# Patient Record
Sex: Female | Born: 1975
Health system: Southern US, Community
[De-identification: ages and names within clinical notes are randomized; demographics above are authoritative.]

## PROBLEM LIST (undated history)

## (undated) DIAGNOSIS — H548 Legal blindness, as defined in USA: Secondary | ICD-10-CM

## (undated) DIAGNOSIS — C801 Malignant (primary) neoplasm, unspecified: Secondary | ICD-10-CM

## (undated) HISTORY — PX: OTHER SURGICAL HISTORY: SHX169

## (undated) HISTORY — PX: INTRAUTERINE DEVICE (IUD) INSERTION: SHX5877

## (undated) HISTORY — PX: BREAST ENHANCEMENT SURGERY: SHX7

## (undated) HISTORY — DX: Legal blindness, as defined in USA: H54.8

## (undated) HISTORY — PX: AUGMENTATION MAMMAPLASTY: SUR837

---

## 2006-05-28 ENCOUNTER — Emergency Department: Payer: Self-pay | Admitting: Unknown Physician Specialty

## 2007-03-08 ENCOUNTER — Observation Stay: Payer: Self-pay | Admitting: Obstetrics & Gynecology

## 2007-03-22 ENCOUNTER — Ambulatory Visit: Payer: Self-pay | Admitting: Obstetrics and Gynecology

## 2007-03-24 ENCOUNTER — Inpatient Hospital Stay: Payer: Self-pay

## 2007-05-01 ENCOUNTER — Ambulatory Visit: Payer: Self-pay | Admitting: Pediatrics

## 2016-09-27 DIAGNOSIS — Z23 Encounter for immunization: Secondary | ICD-10-CM | POA: Diagnosis not present

## 2016-09-27 DIAGNOSIS — Z131 Encounter for screening for diabetes mellitus: Secondary | ICD-10-CM | POA: Diagnosis not present

## 2016-09-27 DIAGNOSIS — Z0000 Encounter for general adult medical examination without abnormal findings: Secondary | ICD-10-CM | POA: Diagnosis not present

## 2016-09-27 DIAGNOSIS — K429 Umbilical hernia without obstruction or gangrene: Secondary | ICD-10-CM | POA: Diagnosis not present

## 2016-12-30 DIAGNOSIS — L308 Other specified dermatitis: Secondary | ICD-10-CM | POA: Diagnosis not present

## 2017-02-17 ENCOUNTER — Encounter: Payer: Self-pay | Admitting: Obstetrics & Gynecology

## 2017-02-17 ENCOUNTER — Ambulatory Visit (INDEPENDENT_AMBULATORY_CARE_PROVIDER_SITE_OTHER): Payer: BLUE CROSS/BLUE SHIELD | Admitting: Obstetrics & Gynecology

## 2017-02-17 VITALS — BP 90/60 | HR 64 | Ht 65.5 in | Wt 146.0 lb

## 2017-02-17 DIAGNOSIS — Z124 Encounter for screening for malignant neoplasm of cervix: Secondary | ICD-10-CM | POA: Diagnosis not present

## 2017-02-17 DIAGNOSIS — Z1231 Encounter for screening mammogram for malignant neoplasm of breast: Secondary | ICD-10-CM

## 2017-02-17 DIAGNOSIS — Z01419 Encounter for gynecological examination (general) (routine) without abnormal findings: Secondary | ICD-10-CM

## 2017-02-17 DIAGNOSIS — Z0000 Encounter for general adult medical examination without abnormal findings: Secondary | ICD-10-CM

## 2017-02-17 DIAGNOSIS — Z1239 Encounter for other screening for malignant neoplasm of breast: Secondary | ICD-10-CM

## 2017-02-17 NOTE — Progress Notes (Signed)
HPI:      [name] is a 41 y.o. G2P1011 who LMP was Patient's last menstrual period was 05/27/2006., she presents today for her annual examination. The patient has no complaints today. The patient is sexually active. Her last pap: approximate date 2015 and was normal and last mammogram: was normal. The patient does perform self breast exams.  There is no notable family history of breast or ovarian cancer in her family.  The patient has regular exercise: yes.  The patient denies current symptoms of depression.    GYN History: Contraception: IUD  PMHx: Past Medical History:  Diagnosis Date  . Legally blind    Past Surgical History:  Procedure Laterality Date  . BREAST ENHANCEMENT SURGERY    . CESAREAN SECTION    . cyro    . INTRAUTERINE DEVICE (IUD) INSERTION     Family History  Problem Relation Age of Onset  . Diabetes Maternal Grandmother   . Breast cancer Maternal Aunt   . Diabetes Maternal Aunt    Social History  Substance Use Topics  . Smoking status: Never Smoker  . Smokeless tobacco: Never Used  . Alcohol use Yes    Current Outpatient Prescriptions:  .  meloxicam (MOBIC) 7.5 MG tablet, Take 1-2 tablets daily as needed. Take with food., Disp: , Rfl:  .  tiZANidine (ZANAFLEX) 2 MG tablet, Take 1-2 tablets three times a day as needed for muscle spasm. Caution sedation., Disp: , Rfl:  Allergies: Patient has no known allergies.  Review of Systems  Constitutional: Negative for chills, fever and malaise/fatigue.  HENT: Negative for congestion, sinus pain and sore throat.   Eyes: Negative for blurred vision and pain.  Respiratory: Negative for cough and wheezing.   Cardiovascular: Negative for chest pain and leg swelling.  Gastrointestinal: Negative for abdominal pain, constipation, diarrhea, heartburn, nausea and vomiting.  Genitourinary: Negative for dysuria, frequency, hematuria and urgency.  Musculoskeletal: Negative for back pain, joint pain, myalgias  and neck pain.  Skin: Negative for itching and rash.  Neurological: Negative for dizziness, tremors and weakness.  Endo/Heme/Allergies: Does not bruise/bleed easily.  Psychiatric/Behavioral: Negative for depression. The patient is not nervous/anxious and does not have insomnia.     Objective: BP 90/60   Pulse 64   Ht 5' 5.5" (1.664 m)   Wt 146 lb (66.2 kg)   LMP 05/27/2006   BMI 23.93 kg/m   Filed Weights   02/17/17 1004  Weight: 146 lb (66.2 kg)   Body mass index is 23.93 kg/m. Physical Exam  Constitutional: She is oriented to person, place, and time. She appears well-developed and well-nourished. No distress.  Genitourinary: Rectum normal, vagina normal and uterus normal. Pelvic exam was performed with patient supine. There is no rash or lesion on the right labia. There is no rash or lesion on the left labia. Vagina exhibits no lesion. No bleeding in the vagina. Right adnexum does not display mass and does not display tenderness. Left adnexum does not display mass and does not display tenderness. Cervix does not exhibit motion tenderness, lesion, friability or polyp.   Uterus is mobile and midaxial. Uterus is not enlarged or exhibiting a mass.  Genitourinary Comments: IUD strings 1 cm seen  HENT:  Head: Normocephalic and atraumatic. Head is without laceration.  Right Ear: Hearing normal.  Left Ear: Hearing normal.  Nose: No epistaxis.  No foreign bodies.  Mouth/Throat: Uvula is midline, oropharynx is clear and moist and mucous membranes are normal.  Eyes: Pupils  are equal, round, and reactive to light.  Neck: Normal range of motion. Neck supple. No thyromegaly present.  Cardiovascular: Normal rate and regular rhythm.  Exam reveals no gallop and no friction rub.   No murmur heard. Pulmonary/Chest: Effort normal and breath sounds normal. No respiratory distress. She has no wheezes. Right breast exhibits no mass, no skin change and no tenderness. Left breast exhibits no mass, no  skin change and no tenderness.  Implants symmetrical, intact  Abdominal: Soft. Bowel sounds are normal. She exhibits no distension. There is no tenderness. There is no rebound.  Musculoskeletal: Normal range of motion.  Neurological: She is alert and oriented to person, place, and time. No cranial nerve deficit.  Skin: Skin is warm and dry.  Psychiatric: She has a normal mood and affect. Judgment normal.  Vitals reviewed.   Assessment:  ANNUAL EXAM 1. Annual physical exam   2. Screening for breast cancer   3. Screening for cervical cancer      Screening Plan:            1.  Cervical Screening-  Pap smear done today  2. Breast screening- Exam annually and mammogram>40 planned   3. Labs managed by PCP  4. Counseling for contraception: IUD; plan exchange next year  Other:  1. Annual physical exam  2. Screening for breast cancer - MM DIGITAL SCREENING BILATERAL; Future  3. Screening for cervical cancer - IGP, Aptima HPV  4. Ca and Vit D for OP prevention, exercise too    F/U  Return in about 1 year (around 02/17/2018) for Annual and IUD exchange.  Barnett Applebaum, MD, [facility], [facility] 02/17/2017  10:45 AM

## 2017-02-17 NOTE — Patient Instructions (Signed)
PAP every three years Mammogram every year    Call [phone] to schedule at [facility] yearly (with PCP)   

## 2017-02-21 LAB — IGP, APTIMA HPV
HPV Aptima: NEGATIVE
PAP Smear Comment: 0

## 2017-04-06 ENCOUNTER — Ambulatory Visit
Admission: RE | Admit: 2017-04-06 | Discharge: 2017-04-06 | Disposition: A | Discharge status: Home / Self Care | Payer: BLUE CROSS/BLUE SHIELD | Source: Ambulatory Visit | Attending: Obstetrics & Gynecology | Admitting: Obstetrics & Gynecology

## 2017-04-06 ENCOUNTER — Other Ambulatory Visit: Payer: Self-pay | Admitting: Obstetrics & Gynecology

## 2017-04-06 DIAGNOSIS — Z1239 Encounter for other screening for malignant neoplasm of breast: Secondary | ICD-10-CM

## 2017-04-06 DIAGNOSIS — Z1231 Encounter for screening mammogram for malignant neoplasm of breast: Secondary | ICD-10-CM | POA: Diagnosis not present

## 2017-04-06 HISTORY — DX: Malignant (primary) neoplasm, unspecified: C80.1

## 2017-04-13 ENCOUNTER — Inpatient Hospital Stay
Admission: RE | Admit: 2017-04-13 | Discharge: 2017-04-13 | Disposition: A | Discharge status: Home / Self Care | Payer: Self-pay | Source: Ambulatory Visit | Attending: *Deleted | Admitting: *Deleted

## 2017-04-13 ENCOUNTER — Other Ambulatory Visit: Payer: Self-pay | Admitting: *Deleted

## 2017-04-13 DIAGNOSIS — Z9289 Personal history of other medical treatment: Secondary | ICD-10-CM

## 2017-04-14 ENCOUNTER — Encounter: Payer: Self-pay | Admitting: Obstetrics & Gynecology

## 2017-05-03 DIAGNOSIS — R1013 Epigastric pain: Secondary | ICD-10-CM | POA: Diagnosis not present

## 2017-05-03 DIAGNOSIS — R110 Nausea: Secondary | ICD-10-CM | POA: Diagnosis not present

## 2017-05-18 DIAGNOSIS — D729 Disorder of white blood cells, unspecified: Secondary | ICD-10-CM | POA: Diagnosis not present

## 2017-12-29 DIAGNOSIS — D2262 Melanocytic nevi of left upper limb, including shoulder: Secondary | ICD-10-CM | POA: Diagnosis not present

## 2017-12-29 DIAGNOSIS — D2261 Melanocytic nevi of right upper limb, including shoulder: Secondary | ICD-10-CM | POA: Diagnosis not present

## 2017-12-29 DIAGNOSIS — D2272 Melanocytic nevi of left lower limb, including hip: Secondary | ICD-10-CM | POA: Diagnosis not present

## 2017-12-29 DIAGNOSIS — D225 Melanocytic nevi of trunk: Secondary | ICD-10-CM | POA: Diagnosis not present

## 2018-02-24 ENCOUNTER — Encounter: Payer: Self-pay | Admitting: Obstetrics & Gynecology

## 2018-02-24 ENCOUNTER — Ambulatory Visit (INDEPENDENT_AMBULATORY_CARE_PROVIDER_SITE_OTHER): Payer: BLUE CROSS/BLUE SHIELD | Admitting: Obstetrics & Gynecology

## 2018-02-24 VITALS — BP 102/60 | Ht 65.5 in | Wt 149.0 lb

## 2018-02-24 DIAGNOSIS — Z30433 Encounter for removal and reinsertion of intrauterine contraceptive device: Secondary | ICD-10-CM | POA: Diagnosis not present

## 2018-02-24 DIAGNOSIS — Z0000 Encounter for general adult medical examination without abnormal findings: Secondary | ICD-10-CM

## 2018-02-24 DIAGNOSIS — Z01419 Encounter for gynecological examination (general) (routine) without abnormal findings: Secondary | ICD-10-CM | POA: Diagnosis not present

## 2018-02-24 DIAGNOSIS — Z1239 Encounter for other screening for malignant neoplasm of breast: Secondary | ICD-10-CM

## 2018-02-24 NOTE — Progress Notes (Signed)
HPI:      Cynthia Osborn is a 42 y.o. G2P1011 who LMP was No LMP recorded. (Menstrual status: IUD)., she presents today for her annual examination. The patient has no complaints today. The patient is sexually active. Her last pap: approximate date 2018 and was normal and last mammogram: approximate date 2018 and was normal. The patient does perform self breast exams.  There is no notable family history of breast or ovarian cancer in her family.  The patient has regular exercise: yes.  The patient denies current symptoms of depression.    GYN History: Contraception: IUD  PMHx: Past Medical History:  Diagnosis Date  . Cancer ([place])    skin  . Legally blind    Past Surgical History:  Procedure Laterality Date  . AUGMENTATION MAMMAPLASTY Bilateral    2000  . BREAST ENHANCEMENT SURGERY    . CESAREAN SECTION    . cyro    . INTRAUTERINE DEVICE (IUD) INSERTION     Family History  Problem Relation Age of Onset  . Diabetes Maternal Grandmother   . Breast cancer Maternal Aunt   . Diabetes Maternal Aunt    Social History   Tobacco Use  . Smoking status: Never Smoker  . Smokeless tobacco: Never Used  Substance Use Topics  . Alcohol use: Yes  . Drug use: No    Current Outpatient Medications:  .  meloxicam (MOBIC) 7.5 MG tablet, Take 1-2 tablets daily as needed. Take with food., Disp: , Rfl:  .  tiZANidine (ZANAFLEX) 2 MG tablet, Take 1-2 tablets three times a day as needed for muscle spasm. Caution sedation., Disp: , Rfl:  Allergies: Patient has no known allergies.  Review of Systems  Constitutional: Negative for chills, fever and malaise/fatigue.  HENT: Negative for congestion, sinus pain and sore throat.   Eyes: Negative for blurred vision and pain.  Respiratory: Negative for cough and wheezing.   Cardiovascular: Negative for chest pain and leg swelling.  Gastrointestinal: Negative for abdominal pain, constipation, diarrhea, heartburn, nausea and vomiting.    Genitourinary: Negative for dysuria, frequency, hematuria and urgency.  Musculoskeletal: Negative for back pain, joint pain, myalgias and neck pain.  Skin: Negative for itching and rash.  Neurological: Negative for dizziness, tremors and weakness.  Endo/Heme/Allergies: Does not bruise/bleed easily.  Psychiatric/Behavioral: Negative for depression. The patient is not nervous/anxious and does not have insomnia.     Objective: BP 102/60   Ht 5' 5.5" (1.664 m)   Wt 149 lb (67.6 kg)   BMI 24.42 kg/m   Filed Weights   02/24/18 0900  Weight: 149 lb (67.6 kg)   Body mass index is 24.42 kg/m. Physical Exam  Constitutional: She is oriented to person, place, and time. She appears well-developed and well-nourished. No distress.  Genitourinary: Rectum normal, vagina normal and uterus normal. Pelvic exam was performed with patient supine. There is no rash or lesion on the right labia. There is no rash or lesion on the left labia. Vagina exhibits no lesion. No bleeding in the vagina. Right adnexum does not display mass and does not display tenderness. Left adnexum does not display mass and does not display tenderness. Cervix does not exhibit motion tenderness, lesion, friability or polyp.   Uterus is mobile and midaxial. Uterus is not enlarged or exhibiting a mass.  Genitourinary Comments: IUD string 2 cm  HENT:  Head: Normocephalic and atraumatic. Head is without laceration.  Right Ear: Hearing normal.  Left Ear: Hearing normal.  Nose: No epistaxis.  No foreign bodies.  Mouth/Throat: Uvula is midline, oropharynx is clear and moist and mucous membranes are normal.  Eyes: Pupils are equal, round, and reactive to light.  Neck: Normal range of motion. Neck supple. No thyromegaly present.  Cardiovascular: Normal rate and regular rhythm. Exam reveals no gallop and no friction rub.  No murmur heard. Pulmonary/Chest: Effort normal and breath sounds normal. No respiratory distress. She has no wheezes.  Right breast exhibits no mass, no skin change and no tenderness. Left breast exhibits no mass, no skin change and no tenderness.  Abdominal: Soft. Bowel sounds are normal. She exhibits no distension. There is no tenderness. There is no rebound.  Musculoskeletal: Normal range of motion.  Neurological: She is alert and oriented to person, place, and time. No cranial nerve deficit.  Skin: Skin is warm and dry.  Psychiatric: She has a normal mood and affect. Judgment normal.  Vitals reviewed.   Assessment:  ANNUAL EXAM 1. Annual physical exam   2. Encounter for removal and reinsertion of intrauterine contraceptive device   3. Screening for breast cancer      Screening Plan:            1.  Cervical Screening-  Pap smear schedule reviewed with patient  2. Breast screening- Exam annually and mammogram>40 planned   3. Colonoscopy every 10 years, Hemoccult testing - after age 25  4. Labs managed by PCP  5. Counseling for contraception: IUD   6.  Encounter for removal and reinsertion of intrauterine contraceptive device  IUD Removal Strings of IUD identified and grasped.  IUD removed without problem.  Pt tolerated this well.  IUD noted to be intact.  IUD Insertion Procedure Note Patient identified, informed consent performed, consent signed.   Discussed risks of irregular bleeding, cramping, infection, malpositioning or misplacement of the IUD outside the uterus which may require further procedure such as laparoscopy, risk of failure <1%. Time out was performed.  Urine pregnancy test negative.  A bimanual exam showed the uterus to be midposition.  Speculum placed in the vagina.  Cervix visualized.  Cleaned with Betadine x 2.  Grasped anteriorly with a single tooth tenaculum.  Uterus sounded to 8 cm.   IUD placed per manufacturer's recommendations.  Strings trimmed to 3 cm. Tenaculum was removed, good hemostasis noted.  Patient tolerated procedure well.   Patient was given post-procedure  instructions.  She was advised to have backup contraception for one week.  Patient was also asked to check IUD strings periodically and follow up in 4 weeks for IUD check.    F/U  Return in about 4 weeks (around 03/24/2018) for Follow up.  Barnett Applebaum, MD, [facility], [facility] 02/24/2018  9:31 AM

## 2018-02-24 NOTE — Patient Instructions (Addendum)
PAP every three years Mammogram every year    Call [phone] to schedule at [facility] (after 04/06/18) Labs yearly (with PCP)  Mirena Intrauterine Device Insertion, Care After This sheet gives you information about how to care for yourself after your procedure. Your health care provider may also give you more specific instructions. If you have problems or questions, contact your health care provider. What can I expect after the procedure? After the procedure, it is common to have:  Cramps and pain in the abdomen.  Light bleeding (spotting) or heavier bleeding that is like your menstrual period. This may last for up to a few days.  Lower back pain.  Dizziness.  Headaches.  Nausea.  Follow these instructions at home:  Before resuming sexual activity, check to make sure that you can feel the IUD string(s). You should be able to feel the end of the string(s) below the opening of your cervix. If your IUD string is in place, you may resume sexual activity. ? If you had a hormonal IUD inserted more than 7 days after your most recent period started, you will need to use a backup method of birth control for 7 days after IUD insertion. Ask your health care provider whether this applies to you.  Continue to check that the IUD is still in place by feeling for the string(s) after every menstrual period, or once a month.  Take over-the-counter and prescription medicines only as told by your health care provider.  Do not drive or use heavy machinery while taking prescription pain medicine.  Keep all follow-up visits as told by your health care provider. This is important. Contact a health care provider if:  You have bleeding that is heavier or lasts longer than a normal menstrual cycle.  You have a fever.  You have cramps or abdominal pain that get worse or do not get better with medicine.  You develop abdominal pain that is new or is not in the same area of earlier cramping and  pain.  You feel lightheaded or weak.  You have abnormal or bad-smelling discharge from your vagina.  You have pain during sexual activity.  You have any of the following problems with your IUD string(s): ? The string bothers or hurts you or your sexual partner. ? You cannot feel the string. ? The string has gotten longer.  You can feel the IUD in your vagina.  You think you may be pregnant, or you miss your menstrual period.  You think you may have an STI (sexually transmitted infection). Get help right away if:  You have flu-like symptoms.  You have a fever and chills.  You can feel that your IUD has slipped out of place. Summary  After the procedure, it is common to have cramps and pain in the abdomen. It is also common to have light bleeding (spotting) or heavier bleeding that is like your menstrual period.  Continue to check that the IUD is still in place by feeling for the string(s) after every menstrual period, or once a month.  Keep all follow-up visits as told by your health care provider. This is important.  Contact your health care provider if you have problems with your IUD string(s), such as the string getting longer or bothering you or your sexual partner. This information is not intended to replace advice given to you by your health care provider. Make sure you discuss any questions you have with your health care provider. Document Released: 12/09/2010 Document Revised: 03/03/2016  Document Reviewed: 03/03/2016 Elsevier Interactive Patient Education  2017 [employer].

## 2018-03-27 ENCOUNTER — Ambulatory Visit (INDEPENDENT_AMBULATORY_CARE_PROVIDER_SITE_OTHER): Payer: BLUE CROSS/BLUE SHIELD | Admitting: Obstetrics & Gynecology

## 2018-03-27 ENCOUNTER — Encounter: Payer: Self-pay | Admitting: Obstetrics & Gynecology

## 2018-03-27 VITALS — BP 100/60 | Ht 65.5 in | Wt 150.0 lb

## 2018-03-27 DIAGNOSIS — Z30431 Encounter for routine checking of intrauterine contraceptive device: Secondary | ICD-10-CM

## 2018-03-27 NOTE — Progress Notes (Signed)
  History of Present Illness:  Cynthia Osborn is a 42 y.o. that had a Mirena IUD placed approximately 4 weeks ago. Since that time, she states that she has had no bleeding and pain  PMHx: She  has a past medical history of Cancer ([place]) and Legally blind. Also,  has a past surgical history that includes Breast enhancement surgery; Cesarean section; cyro; Intrauterine device (iud) insertion; and Augmentation mammaplasty (Bilateral)., family history includes Breast cancer in her maternal aunt; Diabetes in her maternal aunt and maternal grandmother.,  reports that she has never smoked. She has never used smokeless tobacco. She reports that she drinks alcohol. She reports that she does not use drugs. Current Meds  Medication Sig  . levonorgestrel (MIRENA) 20 MCG/24HR IUD 1 each by Intrauterine route once.  .  Also, has No Known Allergies..  ROS  Physical Exam:  BP 100/60   Ht 5' 5.5" (1.664 m)   Wt 150 lb (68 kg)   BMI 24.58 kg/m  Body mass index is 24.58 kg/m. Constitutional: Well nourished, well developed female in no acute distress.  Abdomen: diffusely non tender to palpation, non distended, and no masses, hernias Neuro: Grossly intact Psych:  Normal mood and affect.    Pelvic exam:  Two IUD strings present seen coming from the cervical os. EGBUS, vaginal vault and cervix: within normal limits  Assessment: IUD strings present in proper location; pt doing well  Plan: She was told to continue to use barrier contraception, in order to prevent any STIs, and to take a home pregnancy test or call us if she ever thinks she may be pregnant, and that her IUD expires in 5 (-7) years.  She was amenable to this plan and we will see her back in 1 year/PRN.  A total of 15 minutes were spent face-to-face with the patient during this encounter and over half of that time dealt with counseling and coordination of care.  Barnett Applebaum, MD, [facility], [facility] 03/27/2018   10:10 AM

## 2018-04-10 ENCOUNTER — Ambulatory Visit
Admission: RE | Admit: 2018-04-10 | Discharge: 2018-04-10 | Disposition: A | Discharge status: Home / Self Care | Payer: BLUE CROSS/BLUE SHIELD | Source: Ambulatory Visit | Attending: Obstetrics & Gynecology | Admitting: Obstetrics & Gynecology

## 2018-04-10 DIAGNOSIS — Z1239 Encounter for other screening for malignant neoplasm of breast: Secondary | ICD-10-CM

## 2018-04-10 DIAGNOSIS — Z1231 Encounter for screening mammogram for malignant neoplasm of breast: Secondary | ICD-10-CM | POA: Diagnosis not present

## 2019-01-04 DIAGNOSIS — L718 Other rosacea: Secondary | ICD-10-CM | POA: Diagnosis not present

## 2019-01-04 DIAGNOSIS — D2261 Melanocytic nevi of right upper limb, including shoulder: Secondary | ICD-10-CM | POA: Diagnosis not present

## 2019-01-04 DIAGNOSIS — D2262 Melanocytic nevi of left upper limb, including shoulder: Secondary | ICD-10-CM | POA: Diagnosis not present

## 2019-01-04 DIAGNOSIS — D225 Melanocytic nevi of trunk: Secondary | ICD-10-CM | POA: Diagnosis not present

## 2019-01-30 IMAGING — MG DIGITAL SCREENING BILATERAL MAMMOGRAM WITH IMPLANTS, CAD AND TOM
9 of 16 series · 9 of 32 positions shown · non-contrast
Comparison: Previous exam(s).

CLINICAL DATA: Screening.

EXAM:
DIGITAL SCREENING BILATERAL MAMMOGRAM WITH IMPLANTS, CAD AND TOMO
The patient has retroglandular implants. Standard and implant
displaced views were performed.

[R MLO (1 of 2)]
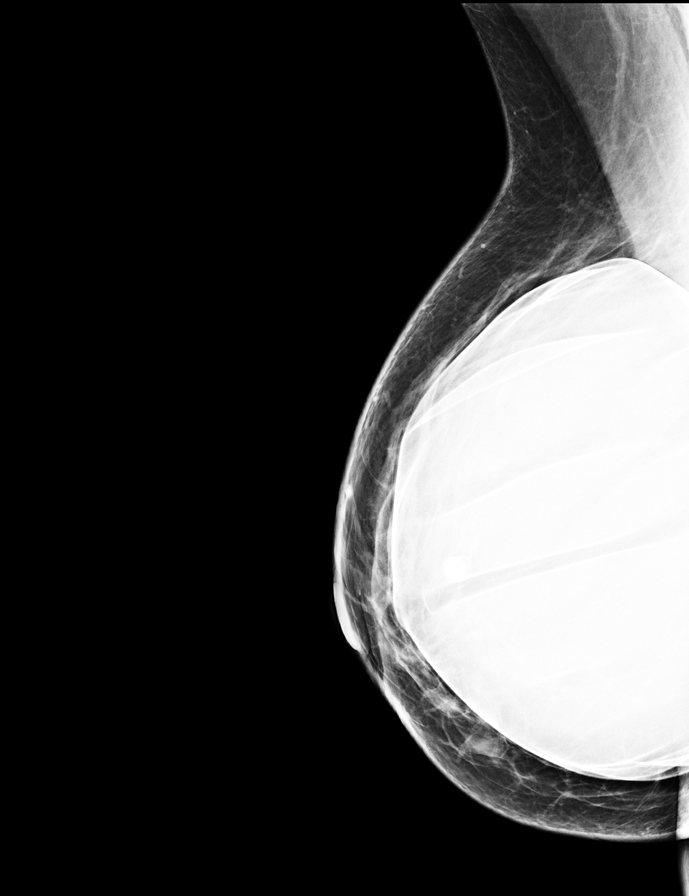

[L CC (1 of 2)]
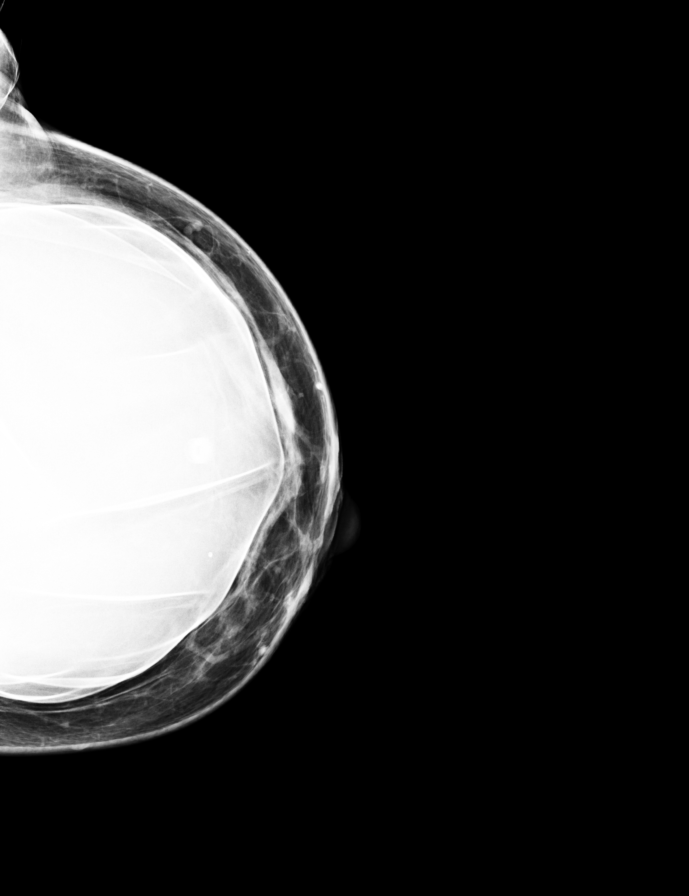

[R CC]
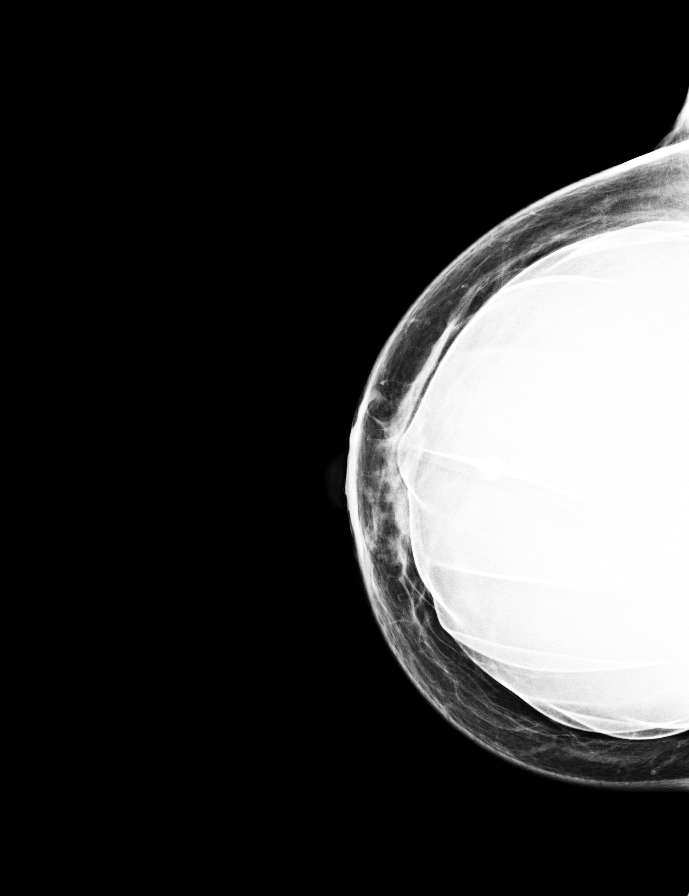

[L MLO]
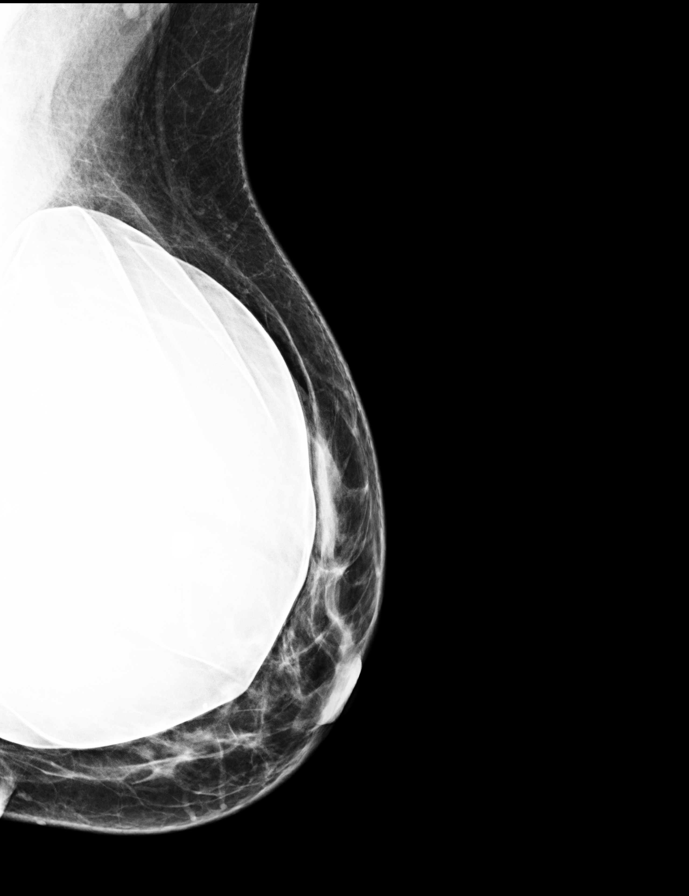

[L MLO synth-2D]
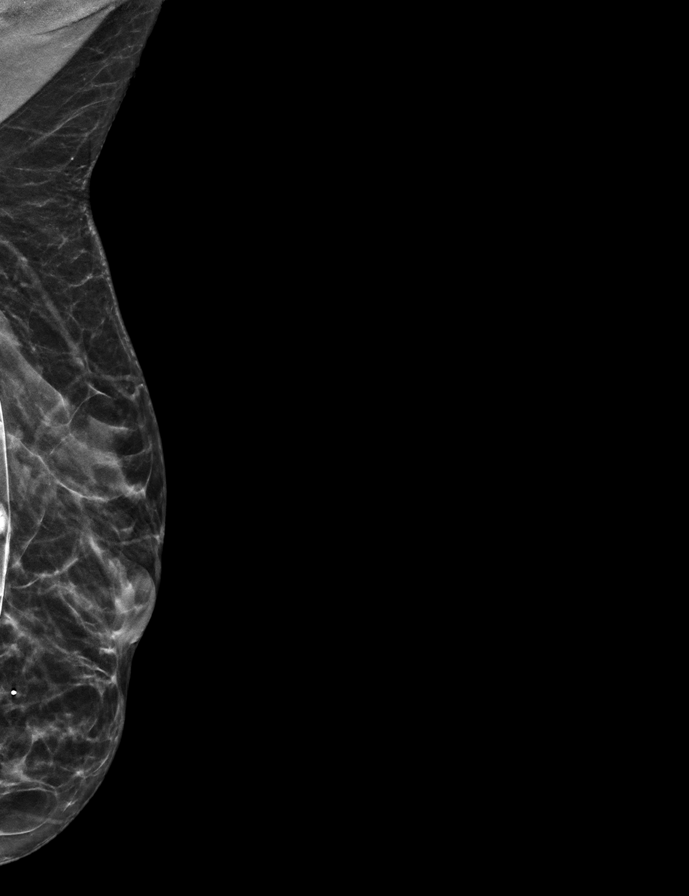

[R CC synth-2D]
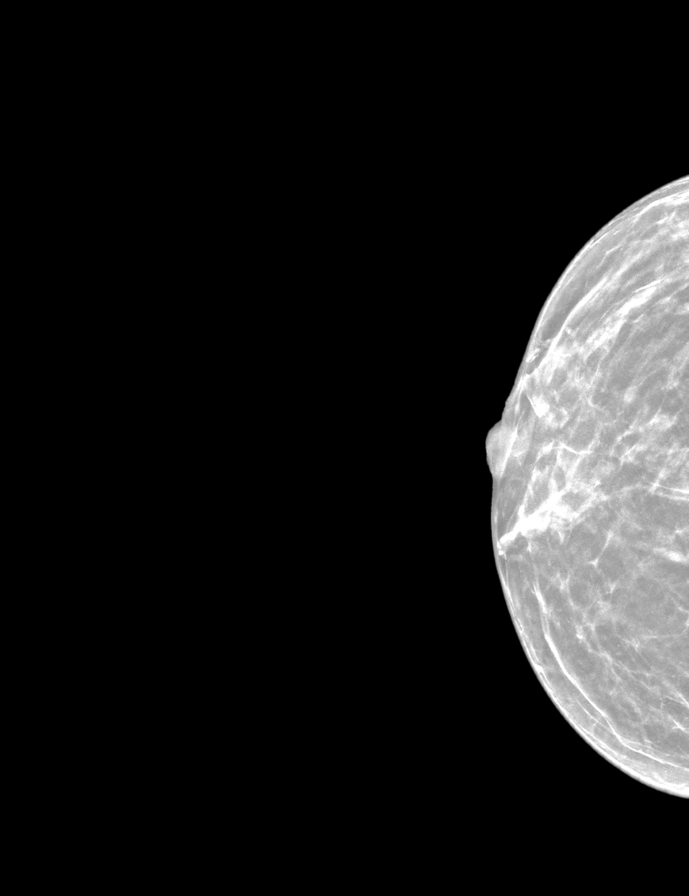

[R MLO (2 of 2)]
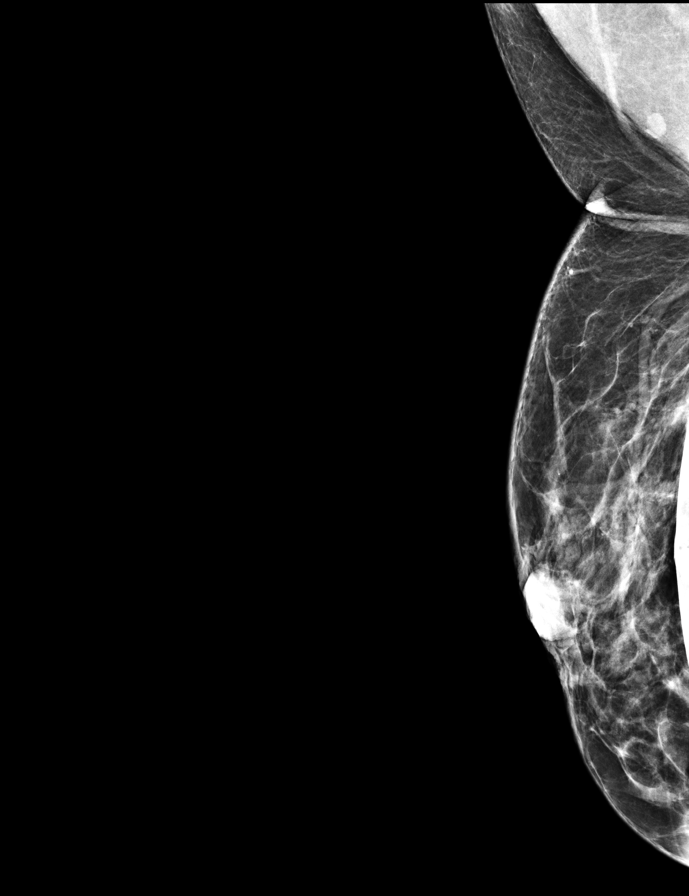

[L CC (2 of 2)]
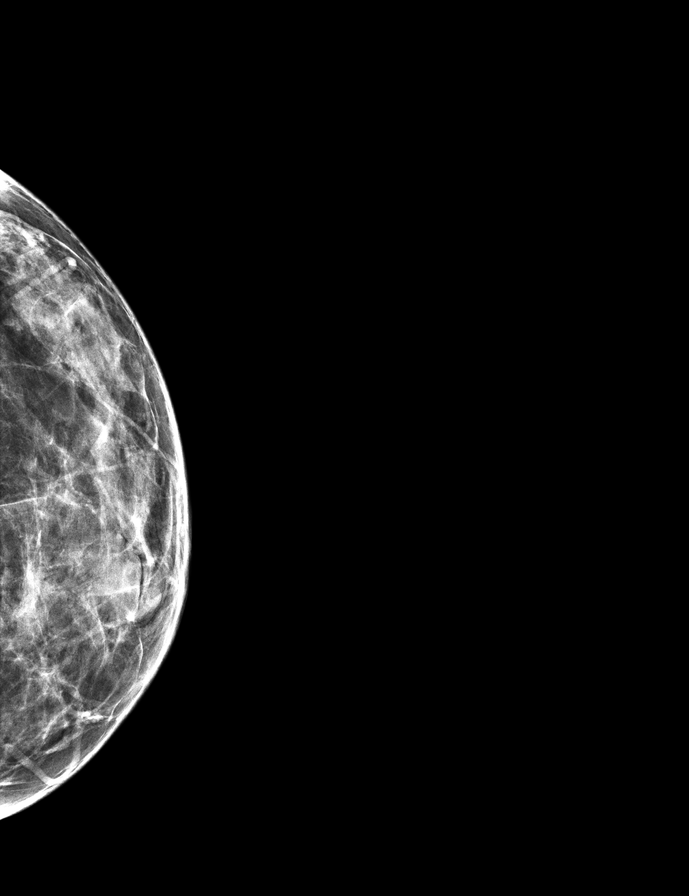

[R MLO synth-2D]
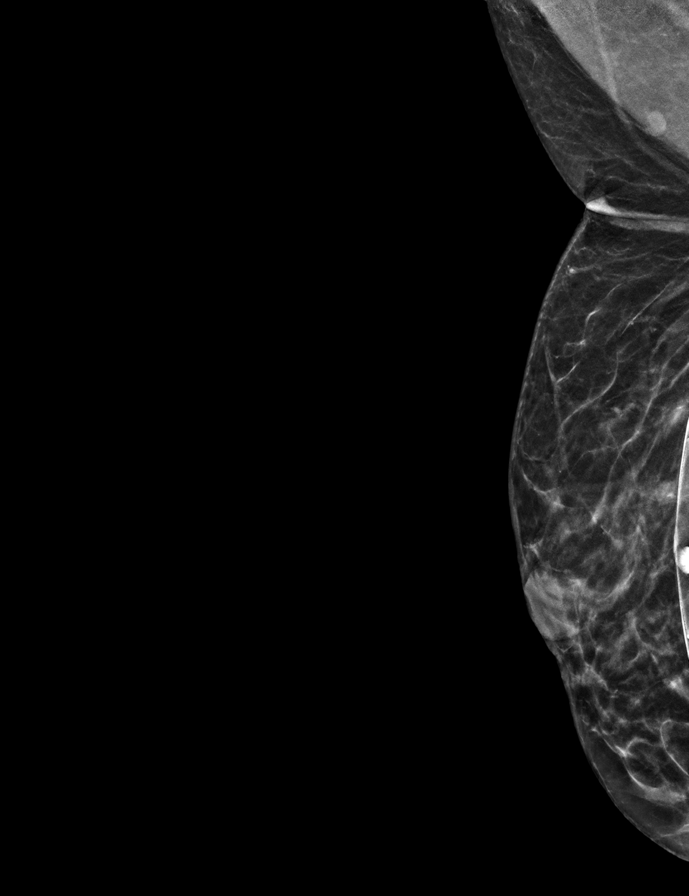

[9 of 32 positions shown; findings below may reference images not displayed]

ACR Breast Density Category b: There are scattered areas of
fibroglandular density.
FINDINGS: Bilateral retroglandular saline implants are intact and unchanged.

There are no findings suspicious for malignancy. Images were
processed with CAD.
IMPRESSION: No mammographic evidence of malignancy. A result letter of this
screening mammogram will be mailed directly to the patient.

RECOMMENDATION:
Screening mammogram in one year. (Code:9Y-R-ETC)

BI-RADS CATEGORY  1:  Negative.

## 2019-03-16 ENCOUNTER — Ambulatory Visit: Payer: BLUE CROSS/BLUE SHIELD | Admitting: Obstetrics & Gynecology

## 2019-03-27 ENCOUNTER — Ambulatory Visit (INDEPENDENT_AMBULATORY_CARE_PROVIDER_SITE_OTHER): Payer: BC Managed Care – PPO | Admitting: Obstetrics & Gynecology

## 2019-03-27 ENCOUNTER — Other Ambulatory Visit: Payer: Self-pay

## 2019-03-27 ENCOUNTER — Encounter: Payer: Self-pay | Admitting: Obstetrics & Gynecology

## 2019-03-27 VITALS — BP 120/80 | Ht 65.0 in | Wt 153.0 lb

## 2019-03-27 DIAGNOSIS — L718 Other rosacea: Secondary | ICD-10-CM | POA: Diagnosis not present

## 2019-03-27 DIAGNOSIS — Z01419 Encounter for gynecological examination (general) (routine) without abnormal findings: Secondary | ICD-10-CM

## 2019-03-27 DIAGNOSIS — Z1231 Encounter for screening mammogram for malignant neoplasm of breast: Secondary | ICD-10-CM

## 2019-03-27 DIAGNOSIS — Z79899 Other long term (current) drug therapy: Secondary | ICD-10-CM | POA: Diagnosis not present

## 2019-03-27 NOTE — Progress Notes (Signed)
HPI:      [name] is a 43 y.o. G2P1011 who LMP was No LMP recorded. (Menstrual status: IUD)., she presents today for her annual examination. The patient has no complaints today. The patient is sexually active. Her last pap: approximate date 2018 and was normal and last mammogram: approximate date 2019 and was normal. The patient does perform self breast exams.  There is no notable family history of breast or ovarian cancer in her family.  The patient has regular exercise: yes.  The patient denies current symptoms of depression.    GYN History: Contraception: IUD  PMHx: Past Medical History:  Diagnosis Date  . Cancer ([place])    skin  . Legally blind    Past Surgical History:  Procedure Laterality Date  . AUGMENTATION MAMMAPLASTY Bilateral    2000  . BREAST ENHANCEMENT SURGERY    . CESAREAN SECTION    . cyro    . INTRAUTERINE DEVICE (IUD) INSERTION     Family History  Problem Relation Age of Onset  . Diabetes Maternal Grandmother   . Breast cancer Maternal Aunt   . Diabetes Maternal Aunt    Social History   Tobacco Use  . Smoking status: Never Smoker  . Smokeless tobacco: Never Used  Substance Use Topics  . Alcohol use: Yes  . Drug use: No    Current Outpatient Medications:  .  doxycycline (VIBRAMYCIN) 50 MG capsule, Take 50 mg by mouth 2 (two) times daily., Disp: , Rfl:  .  levonorgestrel (MIRENA) 20 MCG/24HR IUD, 1 each by Intrauterine route once., Disp: , Rfl:  .  meloxicam (MOBIC) 7.5 MG tablet, Take 1-2 tablets daily as needed. Take with food., Disp: , Rfl:  .  tiZANidine (ZANAFLEX) 2 MG tablet, Take 1-2 tablets three times a day as needed for muscle spasm. Caution sedation., Disp: , Rfl:  Allergies: Patient has no known allergies.  Review of Systems  Constitutional: Negative for chills, fever and malaise/fatigue.  HENT: Negative for congestion, sinus pain and sore throat.   Eyes: Negative for blurred vision and pain.  Respiratory: Negative for cough  and wheezing.   Cardiovascular: Negative for chest pain and leg swelling.  Gastrointestinal: Negative for abdominal pain, constipation, diarrhea, heartburn, nausea and vomiting.  Genitourinary: Negative for dysuria, frequency, hematuria and urgency.  Musculoskeletal: Negative for back pain, joint pain, myalgias and neck pain.  Skin: Negative for itching and rash.  Neurological: Negative for dizziness, tremors and weakness.  Endo/Heme/Allergies: Does not bruise/bleed easily.  Psychiatric/Behavioral: Negative for depression. The patient is not nervous/anxious and does not have insomnia.     Objective: BP 120/80   Ht 5\' 5"  (1.651 m)   Wt 153 lb (69.4 kg)   BMI 25.46 kg/m   Filed Weights   03/27/19 1348  Weight: 153 lb (69.4 kg)   Body mass index is 25.46 kg/m. Physical Exam Constitutional:      General: She is not in acute distress.    Appearance: She is well-developed.  Genitourinary:     Pelvic exam was performed with patient supine.     Urethra, bladder, vagina, uterus and rectum normal.     No lesions in the vagina.     No vaginal bleeding.     No cervical motion tenderness, friability, lesion or polyp.     IUD strings visualized.     Uterus is mobile.     Uterus is not enlarged.     No uterine mass detected.  Uterus is midaxial.     No right or left adnexal mass present.     Right adnexa not tender.     Left adnexa not tender.  HENT:     Head: Normocephalic and atraumatic. No laceration.     Right Ear: Hearing normal.     Left Ear: Hearing normal.     Mouth/Throat:     Pharynx: Uvula midline.  Eyes:     Pupils: Pupils are equal, round, and reactive to light.  Neck:     Musculoskeletal: Normal range of motion and neck supple.     Thyroid: No thyromegaly.  Cardiovascular:     Rate and Rhythm: Normal rate and regular rhythm.     Heart sounds: No murmur. No friction rub. No gallop.   Pulmonary:     Effort: Pulmonary effort is normal. No respiratory distress.      Breath sounds: Normal breath sounds. No wheezing.  Chest:     Breasts:        Right: No mass, skin change or tenderness.        Left: No mass, skin change or tenderness.  Abdominal:     General: Bowel sounds are normal. There is no distension.     Palpations: Abdomen is soft.     Tenderness: There is no abdominal tenderness. There is no rebound.  Musculoskeletal: Normal range of motion.  Neurological:     Mental Status: She is alert and oriented to person, place, and time.     Cranial Nerves: No cranial nerve deficit.  Skin:    General: Skin is warm and dry.  Psychiatric:        Judgment: Judgment normal.  Vitals signs reviewed.     Assessment:  ANNUAL EXAM 1. Women's annual routine gynecological examination   2. Encounter for screening mammogram for malignant neoplasm of breast      Screening Plan:            1.  Cervical Screening-  Pap smear schedule reviewed with patient  2. Breast screening- Exam annually and mammogram>40 planned   3. Colonoscopy every 10 years, Hemoccult testing - after age 5  4. Labs managed by PCP  5. Counseling for contraception: IUD year 1 of this Mirena    F/U  Return in about 1 year (around 03/26/2020) for Annual.  Barnett Applebaum, MD, [facility], [facility] 03/27/2019  2:38 PM

## 2019-03-27 NOTE — Patient Instructions (Signed)
PAP every three years   Due 2021 Mammogram every year    Call [phone] to schedule at [facility] yearly (with PCP)

## 2019-03-29 DIAGNOSIS — Z3202 Encounter for pregnancy test, result negative: Secondary | ICD-10-CM | POA: Diagnosis not present

## 2019-03-29 DIAGNOSIS — Z79899 Other long term (current) drug therapy: Secondary | ICD-10-CM | POA: Diagnosis not present

## 2019-04-13 ENCOUNTER — Ambulatory Visit
Admission: RE | Admit: 2019-04-13 | Discharge: 2019-04-13 | Disposition: A | Discharge status: Home / Self Care | Payer: BC Managed Care – PPO | Source: Ambulatory Visit | Attending: Obstetrics & Gynecology | Admitting: Obstetrics & Gynecology

## 2019-04-13 DIAGNOSIS — Z1231 Encounter for screening mammogram for malignant neoplasm of breast: Secondary | ICD-10-CM | POA: Diagnosis not present

## 2019-04-18 DIAGNOSIS — L700 Acne vulgaris: Secondary | ICD-10-CM | POA: Diagnosis not present

## 2019-05-22 ENCOUNTER — Other Ambulatory Visit: Payer: Self-pay | Admitting: Gastroenterology

## 2019-05-22 DIAGNOSIS — R748 Abnormal levels of other serum enzymes: Secondary | ICD-10-CM

## 2019-05-22 DIAGNOSIS — R7989 Other specified abnormal findings of blood chemistry: Secondary | ICD-10-CM | POA: Diagnosis not present

## 2019-05-29 ENCOUNTER — Ambulatory Visit: Payer: BC Managed Care – PPO

## 2019-06-05 ENCOUNTER — Ambulatory Visit: Payer: BLUE CROSS/BLUE SHIELD

## 2020-01-11 DIAGNOSIS — Z20822 Contact with and (suspected) exposure to covid-19: Secondary | ICD-10-CM | POA: Diagnosis not present

## 2020-01-11 DIAGNOSIS — J0100 Acute maxillary sinusitis, unspecified: Secondary | ICD-10-CM | POA: Diagnosis not present

## 2020-02-15 DIAGNOSIS — M955 Acquired deformity of pelvis: Secondary | ICD-10-CM | POA: Diagnosis not present

## 2020-02-15 DIAGNOSIS — M9905 Segmental and somatic dysfunction of pelvic region: Secondary | ICD-10-CM | POA: Diagnosis not present

## 2020-02-15 DIAGNOSIS — M9903 Segmental and somatic dysfunction of lumbar region: Secondary | ICD-10-CM | POA: Diagnosis not present

## 2020-02-15 DIAGNOSIS — M5416 Radiculopathy, lumbar region: Secondary | ICD-10-CM | POA: Diagnosis not present

## 2020-04-28 ENCOUNTER — Ambulatory Visit (INDEPENDENT_AMBULATORY_CARE_PROVIDER_SITE_OTHER): Payer: BC Managed Care – PPO | Admitting: Obstetrics & Gynecology

## 2020-04-28 ENCOUNTER — Encounter: Payer: Self-pay | Admitting: Obstetrics & Gynecology

## 2020-04-28 ENCOUNTER — Other Ambulatory Visit: Payer: Self-pay

## 2020-04-28 ENCOUNTER — Other Ambulatory Visit (HOSPITAL_COMMUNITY)
Admission: RE | Admit: 2020-04-28 | Discharge: 2020-04-28 | Disposition: A | Discharge status: Home / Self Care | Payer: BC Managed Care – PPO | Source: Ambulatory Visit | Attending: Obstetrics & Gynecology | Admitting: Obstetrics & Gynecology

## 2020-04-28 VITALS — BP 110/60 | Ht 65.0 in | Wt 155.0 lb

## 2020-04-28 DIAGNOSIS — Z124 Encounter for screening for malignant neoplasm of cervix: Secondary | ICD-10-CM | POA: Insufficient documentation

## 2020-04-28 DIAGNOSIS — Z01419 Encounter for gynecological examination (general) (routine) without abnormal findings: Secondary | ICD-10-CM | POA: Diagnosis not present

## 2020-04-28 DIAGNOSIS — Z1231 Encounter for screening mammogram for malignant neoplasm of breast: Secondary | ICD-10-CM

## 2020-04-28 NOTE — Progress Notes (Signed)
HPI:      [name] is a 45 y.o. G2P1011 who LMP was No LMP recorded. (Menstrual status: IUD)., she presents today for her annual examination. The patient has no complaints today. The patient is sexually active. Her last pap: approximate date 2019 and was normal and last mammogram: approximate date 2020 and was normal. The patient does perform self breast exams.  There is no notable family history of breast or ovarian cancer in her family.  The patient has regular exercise: yes.  The patient denies current symptoms of depression.    GYN History: Contraception: IUD, Mirena, year 2  PMHx: Past Medical History:  Diagnosis Date  . Cancer (HCC)    skin  . Legally blind    Past Surgical History:  Procedure Laterality Date  . AUGMENTATION MAMMAPLASTY Bilateral    2000  . BREAST ENHANCEMENT SURGERY    . CESAREAN SECTION    . cyro    . INTRAUTERINE DEVICE (IUD) INSERTION     Family History  Problem Relation Age of Onset  . Diabetes Maternal Grandmother   . Breast cancer Maternal Aunt        60s  . Diabetes Maternal Aunt    Social History   Tobacco Use  . Smoking status: Never Smoker  . Smokeless tobacco: Never Used  Vaping Use  . Vaping Use: Never used  Substance Use Topics  . Alcohol use: Yes  . Drug use: No    Current Outpatient Medications:  .  levonorgestrel (MIRENA) 20 MCG/24HR IUD, 1 each by Intrauterine route once., Disp: , Rfl:  .  Ivermectin 1 % CREA, Apply topically. (Patient not taking: Reported on 04/28/2020), Disp: , Rfl:  Allergies: Patient has no known allergies.  Review of Systems  Constitutional: Negative for chills, fever and malaise/fatigue.  HENT: Negative for congestion, sinus pain and sore throat.   Eyes: Negative for blurred vision and pain.  Respiratory: Negative for cough and wheezing.   Cardiovascular: Negative for chest pain and leg swelling.  Gastrointestinal: Negative for abdominal pain, constipation, diarrhea, heartburn, nausea and  vomiting.  Genitourinary: Negative for dysuria, frequency, hematuria and urgency.  Musculoskeletal: Negative for back pain, joint pain, myalgias and neck pain.  Skin: Negative for itching and rash.  Neurological: Negative for dizziness, tremors and weakness.  Endo/Heme/Allergies: Does not bruise/bleed easily.  Psychiatric/Behavioral: Negative for depression. The patient is not nervous/anxious and does not have insomnia.     Objective: BP 110/60   Ht 5\' 5"  (1.651 m)   Wt 155 lb (70.3 kg)   BMI 25.79 kg/m   Filed Weights   04/28/20 0958  Weight: 155 lb (70.3 kg)   Body mass index is 25.79 kg/m. Physical Exam Constitutional:      General: She is not in acute distress.    Appearance: She is well-developed and well-nourished.  Genitourinary:     Vagina, uterus and rectum normal.     There is no rash or lesion on the right labia.     There is no rash or lesion on the left labia.    No lesions in the vagina.     No vaginal bleeding.      Right Adnexa: not tender and no mass present.    Left Adnexa: not tender and no mass present.    No cervical motion tenderness, friability, lesion or polyp.     IUD strings visualized.     Uterus is mobile.     Uterus is not enlarged.  No uterine mass detected.    Uterus is midaxial.     Pelvic exam was performed with patient supine.  Breasts:     Right: No mass, skin change or tenderness.     Left: No mass, skin change or tenderness.    HENT:     Head: Normocephalic and atraumatic. No laceration.     Right Ear: Hearing normal.     Left Ear: Hearing normal.     Nose: No epistaxis or foreign body.     Mouth/Throat:     Mouth: Oropharynx is clear and moist and mucous membranes are normal.     Pharynx: Uvula midline.  Eyes:     Pupils: Pupils are equal, round, and reactive to light.  Neck:     Thyroid: No thyromegaly.  Cardiovascular:     Rate and Rhythm: Normal rate and regular rhythm.     Heart sounds: No murmur heard. No  friction rub. No gallop.   Pulmonary:     Effort: Pulmonary effort is normal. No respiratory distress.     Breath sounds: Normal breath sounds. No wheezing.  Abdominal:     General: Bowel sounds are normal. There is no distension.     Palpations: Abdomen is soft.     Tenderness: There is no abdominal tenderness. There is no rebound.  Musculoskeletal:        General: Normal range of motion.     Cervical back: Normal range of motion and neck supple.  Neurological:     Mental Status: She is alert and oriented to person, place, and time.     Cranial Nerves: No cranial nerve deficit.  Skin:    General: Skin is warm and dry.  Psychiatric:        Mood and Affect: Mood and affect normal.        Judgment: Judgment normal.  Vitals reviewed.     Assessment:  ANNUAL EXAM 1. Women's annual routine gynecological examination   2. Screening for cervical cancer   3. Encounter for screening mammogram for malignant neoplasm of breast      Screening Plan:            1.  Cervical Screening-  Pap smear done today  2. Breast screening- Exam annually and mammogram>40 planned   3. Colonoscopy every 10 years, Hemoccult testing - after age 71  4. Labs managed by PCP  5. Counseling for contraception: IUD    F/U  Return in about 1 year (around 04/28/2021) for Annual.  Barnett Applebaum, MD, [facility], [facility] 04/28/2020  10:19 AM

## 2020-04-28 NOTE — Patient Instructions (Signed)
PAP every three years Mammogram every year    Call [phone] to schedule at [facility] yearly (with PCP)  Thank you for choosing [facility]. As part of our ongoing efforts to improve patient experience, we would appreciate your feedback. Please fill out the short survey that you will receive by mail or MyChart. Your opinion is important to [place]! - Dr. Tiburcio Pea  Recommendations to boost your immunity to prevent illness such as viral flu and colds, including covid19, are as follows: Vitamin K2 and Vitamin D3. Take Vitamin K2 at 200-300 mcg daily (usually 2-3 pills daily of the over the counter formulation). Take Vitamin D3 at 3000-4000 U daily (usually 3-4 pills daily of the over the counter formulation). Studies show that these two at high normal levels in your system are very effective in keeping your immunity so strong and protective that you will be unlikely to contract viral illness such as those listed above.  Dr Tiburcio Pea

## 2020-04-30 LAB — CYTOLOGY - PAP
Comment: NEGATIVE
Diagnosis: NEGATIVE
High risk HPV: NEGATIVE

## 2020-05-09 DIAGNOSIS — R070 Pain in throat: Secondary | ICD-10-CM | POA: Diagnosis not present

## 2020-05-09 DIAGNOSIS — Z20822 Contact with and (suspected) exposure to covid-19: Secondary | ICD-10-CM | POA: Diagnosis not present

## 2020-05-26 ENCOUNTER — Other Ambulatory Visit: Payer: Self-pay

## 2020-05-26 ENCOUNTER — Ambulatory Visit
Admission: RE | Admit: 2020-05-26 | Discharge: 2020-05-26 | Disposition: A | Discharge status: Home / Self Care | Payer: BC Managed Care – PPO | Source: Ambulatory Visit | Attending: Obstetrics & Gynecology | Admitting: Obstetrics & Gynecology

## 2020-05-26 DIAGNOSIS — Z1231 Encounter for screening mammogram for malignant neoplasm of breast: Secondary | ICD-10-CM | POA: Insufficient documentation

## 2020-06-20 DIAGNOSIS — D2262 Melanocytic nevi of left upper limb, including shoulder: Secondary | ICD-10-CM | POA: Diagnosis not present

## 2020-06-20 DIAGNOSIS — D225 Melanocytic nevi of trunk: Secondary | ICD-10-CM | POA: Diagnosis not present

## 2020-06-20 DIAGNOSIS — D2271 Melanocytic nevi of right lower limb, including hip: Secondary | ICD-10-CM | POA: Diagnosis not present

## 2020-06-20 DIAGNOSIS — D2261 Melanocytic nevi of right upper limb, including shoulder: Secondary | ICD-10-CM | POA: Diagnosis not present

## 2020-12-02 DIAGNOSIS — W2209XA Striking against other stationary object, initial encounter: Secondary | ICD-10-CM | POA: Diagnosis not present

## 2020-12-02 DIAGNOSIS — S0091XA Abrasion of unspecified part of head, initial encounter: Secondary | ICD-10-CM | POA: Diagnosis not present

## 2021-01-06 DIAGNOSIS — M79672 Pain in left foot: Secondary | ICD-10-CM | POA: Diagnosis not present

## 2021-05-28 ENCOUNTER — Ambulatory Visit: Payer: BC Managed Care – PPO | Admitting: Obstetrics and Gynecology

## 2021-06-02 DIAGNOSIS — Z13220 Encounter for screening for lipoid disorders: Secondary | ICD-10-CM | POA: Diagnosis not present

## 2021-06-02 DIAGNOSIS — Z1211 Encounter for screening for malignant neoplasm of colon: Secondary | ICD-10-CM | POA: Diagnosis not present

## 2021-06-02 DIAGNOSIS — Z131 Encounter for screening for diabetes mellitus: Secondary | ICD-10-CM | POA: Diagnosis not present

## 2021-06-02 DIAGNOSIS — Z7141 Alcohol abuse counseling and surveillance of alcoholic: Secondary | ICD-10-CM | POA: Diagnosis not present

## 2021-06-02 DIAGNOSIS — F1090 Alcohol use, unspecified, uncomplicated: Secondary | ICD-10-CM | POA: Diagnosis not present

## 2021-06-02 DIAGNOSIS — Z1159 Encounter for screening for other viral diseases: Secondary | ICD-10-CM | POA: Diagnosis not present

## 2021-06-02 DIAGNOSIS — Z0000 Encounter for general adult medical examination without abnormal findings: Secondary | ICD-10-CM | POA: Diagnosis not present

## 2021-06-03 ENCOUNTER — Other Ambulatory Visit: Payer: Self-pay | Admitting: Internal Medicine

## 2021-06-03 DIAGNOSIS — R7989 Other specified abnormal findings of blood chemistry: Secondary | ICD-10-CM

## 2021-06-03 DIAGNOSIS — F1090 Alcohol use, unspecified, uncomplicated: Secondary | ICD-10-CM

## 2021-06-10 ENCOUNTER — Ambulatory Visit: Payer: BC Managed Care – PPO

## 2021-06-16 DIAGNOSIS — H6691 Otitis media, unspecified, right ear: Secondary | ICD-10-CM | POA: Diagnosis not present

## 2021-06-16 DIAGNOSIS — Z20822 Contact with and (suspected) exposure to covid-19: Secondary | ICD-10-CM | POA: Diagnosis not present

## 2021-06-16 DIAGNOSIS — R059 Cough, unspecified: Secondary | ICD-10-CM | POA: Diagnosis not present

## 2021-06-16 DIAGNOSIS — J0100 Acute maxillary sinusitis, unspecified: Secondary | ICD-10-CM | POA: Diagnosis not present

## 2021-06-26 DIAGNOSIS — L718 Other rosacea: Secondary | ICD-10-CM | POA: Diagnosis not present

## 2021-06-26 DIAGNOSIS — D2261 Melanocytic nevi of right upper limb, including shoulder: Secondary | ICD-10-CM | POA: Diagnosis not present

## 2021-06-26 DIAGNOSIS — D2271 Melanocytic nevi of right lower limb, including hip: Secondary | ICD-10-CM | POA: Diagnosis not present

## 2021-06-26 DIAGNOSIS — D2262 Melanocytic nevi of left upper limb, including shoulder: Secondary | ICD-10-CM | POA: Diagnosis not present

## 2021-07-27 ENCOUNTER — Encounter: Payer: Self-pay | Admitting: Obstetrics & Gynecology

## 2021-07-27 ENCOUNTER — Ambulatory Visit (INDEPENDENT_AMBULATORY_CARE_PROVIDER_SITE_OTHER): Payer: BC Managed Care – PPO | Admitting: Obstetrics & Gynecology

## 2021-07-27 VITALS — BP 100/70 | Ht 65.0 in | Wt 154.0 lb

## 2021-07-27 DIAGNOSIS — Z01419 Encounter for gynecological examination (general) (routine) without abnormal findings: Secondary | ICD-10-CM

## 2021-07-27 DIAGNOSIS — Z1231 Encounter for screening mammogram for malignant neoplasm of breast: Secondary | ICD-10-CM | POA: Diagnosis not present

## 2021-07-27 NOTE — Progress Notes (Signed)
? ?HPI: ?     [name] is a 46 y.o. G2P1011 who LMP was No LMP recorded. (Menstrual status: IUD)., she presents today for her annual examination. The patient has no complaints today. No period w IUD (yr 3 Mirena).  Has some hormonal symptoms she feels may be related to early menopause.  The patient is sexually active. Her last pap: approximate date 2022 and was normal and last mammogram: approximate date 2022 and was normal. The patient does perform self breast exams.  There is no notable family history of breast or ovarian cancer in her family.  The patient has regular exercise: yes.  The patient denies current symptoms of depression.   ? ?GYN History: ?Contraception: IUD ? ?PMHx: ?Past Medical History:  ?Diagnosis Date  ? Cancer [facility])   ? skin  ? Legally blind   ? ?Past Surgical History:  ?Procedure Laterality Date  ? AUGMENTATION MAMMAPLASTY Bilateral   ? 2000  ? BREAST ENHANCEMENT SURGERY    ? CESAREAN SECTION    ? cyro    ? INTRAUTERINE DEVICE (IUD) INSERTION    ? ?Family History  ?Problem Relation Age of Onset  ? Diabetes Maternal Grandmother   ? Breast cancer Maternal Aunt   ?     52s  ? Diabetes Maternal Aunt   ? ?Social History  ? ?Tobacco Use  ? Smoking status: Never  ? Smokeless tobacco: Never  ?Vaping Use  ? Vaping Use: Never used  ?Substance Use Topics  ? Alcohol use: Yes  ? Drug use: No  ? ? ?Current Outpatient Medications:  ?  levonorgestrel (MIRENA) 20 MCG/24HR IUD, 1 each by Intrauterine route once., Disp: , Rfl:  ?  Ivermectin 1 % CREA, Apply topically. (Patient not taking: Reported on 07/27/2021), Disp: , Rfl:  ?Allergies: Patient has no known allergies. ? ?Review of Systems  ?Constitutional:  Negative for chills, fever and malaise/fatigue.  ?HENT:  Negative for congestion, sinus pain and sore throat.   ?Eyes:  Negative for blurred vision and pain.  ?Respiratory:  Negative for cough and wheezing.   ?Cardiovascular:  Negative for chest pain and leg swelling.  ?Gastrointestinal:  Negative  for abdominal pain, constipation, diarrhea, heartburn, nausea and vomiting.  ?Genitourinary:  Negative for dysuria, frequency, hematuria and urgency.  ?Musculoskeletal:  Negative for back pain, joint pain, myalgias and neck pain.  ?Skin:  Negative for itching and rash.  ?Neurological:  Negative for dizziness, tremors and weakness.  ?Endo/Heme/Allergies:  Does not bruise/bleed easily.  ?Psychiatric/Behavioral:  Negative for depression. The patient is not nervous/anxious and does not have insomnia.   ? ?Objective: ?BP 100/70   Ht '5\' 5"'$  (1.651 m)   Wt 154 lb (69.9 kg)   BMI 25.63 kg/m?   ?Filed Weights  ? 07/27/21 0933  ?Weight: 154 lb (69.9 kg)  ? Body mass index is 25.63 kg/m?Marland Kitchen ?Physical Exam ?Constitutional:   ?   General: She is not in acute distress. ?   Appearance: She is well-developed.  ?Genitourinary:  ?   Bladder, rectum and urethral meatus normal.  ?   No lesions in the vagina.  ?   Right Labia: No rash, tenderness or lesions. ?   Left Labia: No tenderness, lesions or rash. ?   No vaginal bleeding.  ? ?   Right Adnexa: not tender and no mass present. ?   Left Adnexa: not tender and no mass present. ?   No cervical motion tenderness, friability, lesion or polyp.  ?  Uterus is not enlarged.  ?   No uterine mass detected. ?   Pelvic exam was performed with patient in the lithotomy position.  ?Breasts: ?   Right: No mass, skin change or tenderness.  ?   Left: No mass, skin change or tenderness.  ?HENT:  ?   Head: Normocephalic and atraumatic. No laceration.  ?   Right Ear: Hearing normal.  ?   Left Ear: Hearing normal.  ?   Mouth/Throat:  ?   Pharynx: Uvula midline.  ?Eyes:  ?   Pupils: Pupils are equal, round, and reactive to light.  ?Neck:  ?   Thyroid: No thyromegaly.  ?Cardiovascular:  ?   Rate and Rhythm: Normal rate and regular rhythm.  ?   Heart sounds: No murmur heard. ?  No friction rub. No gallop.  ?Pulmonary:  ?   Effort: Pulmonary effort is normal. No respiratory distress.  ?   Breath sounds:  Normal breath sounds. No wheezing.  ?Abdominal:  ?   General: Bowel sounds are normal. There is no distension.  ?   Palpations: Abdomen is soft.  ?   Tenderness: There is no abdominal tenderness. There is no rebound.  ?Musculoskeletal:     ?   General: Normal range of motion.  ?   Cervical back: Normal range of motion and neck supple.  ?Neurological:  ?   Mental Status: She is alert and oriented to person, place, and time.  ?   Cranial Nerves: No cranial nerve deficit.  ?Skin: ?   General: Skin is warm and dry.  ?Psychiatric:     ?   Judgment: Judgment normal.  ?Vitals reviewed.  ? ? ?Assessment:  ANNUAL EXAM ?1. Women's annual routine gynecological examination   ?2. Encounter for screening mammogram for malignant neoplasm of breast   ? ? ? ?Screening Plan: ?           ?1.  Cervical Screening-  Pap smear schedule reviewed with patient ? ?2. Breast screening- Exam annually and mammogram>40 planned  ? ?3. Colonoscopy every 10 years, Hemoccult testing - after age 46 ? ?61. Labs managed by PCP ? ?5. Counseling for contraception: IUD ? ? ?  F/U ? Return for and Annual when due. ? ?Barnett Applebaum, MD, [place] ?[facility], [place] ?07/27/2021  10:24 AM ? ? ?

## 2021-07-27 NOTE — Patient Instructions (Addendum)
This is the new practice information you requested: ? ?[facility] ?[address] ?[address] ?[place], [place]  [address] ?[phone] ? ?PAP every three years (due 2025) ?Mammogram every year ?   Call [phone] to schedule at [facility] ?Colonoscopy every 10 years - soon ?Labs yearly (with PCP) ? ? ?Black Cohosh, Cimicifuga racemosa oral dosage forms ?What is this medication? ?BLACK COHOSH (blak  KOH hosh) or Cimicifuga racemosa is a dietary supplement. It is promoted to relieve symptoms of menopause, such as hot flashes. The FDA has not approved this supplement for any medical use. ?This supplement may be used for other purposes; ask your health care provider or pharmacist if you have questions. ?This medicine may be used for other purposes; ask your health care provider or pharmacist if you have questions. ?What should I tell my care team before I take this medication? ?They need to know if you have any of these conditions: ?breast cancer ?cervical, ovarian or uterine cancer ?high blood pressure ?infertility ?liver disease ?menstrual changes or irregular periods ?unusual vaginal or uterine bleeding ?an unusual or allergic reaction to black cohosh, soybeans, tartrazine dye (yellow dye number 5), other medicines, foods, dyes, or preservatives ?pregnant or trying to get pregnant ?breast-feeding ?How should I use this medication? ?Take this herb by mouth with a glass of water. Follow the directions on the package labeling, or talk to your health care professional. Do not use for longer than 6 months without the advice of a health care professional. Do not use if you are pregnant or breast-feeding. Talk to your obstetrician-gynecologist or certified nurse-midwife. ?This herb is not for use in children under the age of 55 years. ?Overdosage: If you think you have taken too much of this medicine contact a poison control center or emergency room at once. ?NOTE: This medicine is only  for you. Do not share this medicine with others. ?What if I miss a dose? ?If you miss a dose, take it as soon as you can. If it is almost time for your next dose, take only that dose. Do not take double or extra doses. ?What may interact with this medication? ?atorvastatin ?cisplatin ?fertility treatments ?This list may not describe all possible interactions. Give your health care provider a list of all the medicines, herbs, non-prescription drugs, or dietary supplements you use. Also tell them if you smoke, drink alcohol, or use illegal drugs. Some items may interact with your medicine. ?What should I watch for while using this medication? ?Since this herb is derived from a plant, allergic reactions are possible. Stop using this herb if you develop a rash. You may need to see your health care professional, or inform them that this occurred. Report any unusual side effects promptly. ?If you are taking this herb for menstrual or menopausal symptoms, visit your doctor or health care professional for regular checks on your progress. You should have a complete check-up every 6 months. You will need a regular breast and pelvic exam while on this therapy. Follow the advice of your doctor or health care professional. ?Women should inform their doctor if they wish to become pregnant or think they might be pregnant. If you have any reason to think you are pregnant, stop taking this herb at once and contact your doctor or health care professional. ?Herbal or dietary supplements are not regulated like medicines. Rigid quality control standards are not required for dietary supplements. The purity and strength of these products can  vary. The safety and effect of this dietary supplement for a certain disease or illness is not well known. This product is not intended to diagnose, treat, cure or prevent any disease. ?The Food and Drug Administration suggests the following to help consumers protect themselves: ?Always read product  labels and follow directions. ?Natural does not mean a product is safe for humans to take. ?Look for products that include USP after the ingredient name. This means that the manufacturer followed the standards of the U.S. Pharmacopoeia. ?Supplements made or sold by a nationally known food or drug company are more likely to be made under tight controls. You can write to the company for more information about how the product was made. ?What side effects may I notice from receiving this medication? ?Side effects that you should report to your doctor or health care professional as soon as possible: ?allergic reactions like skin rash, itching or hives, swelling of the face, lips, or tongue ?breathing problems ?dizziness ?palpitations ?signs and symptoms of liver injury like dark yellow or brown urine; general ill feeling or flu-like symptoms; light-colored stools; loss of appetite; nausea; right upper belly pain; unusually weak or tired; yellowing of the eyes or skin ?unusual vaginal bleeding ?Side effects that usually do not require medical attention (report to your doctor or health care professional if they continue or are bothersome): ?breast tenderness ?headache ?nausea ?upset stomach ?This list may not describe all possible side effects. Call your doctor for medical advice about side effects. You may report side effects to FDA at 1-800-FDA-1088. ?Where should I keep my medication? ?Keep out of the reach of children. ?Store at room temperature between 15 and 30 degrees C (59 and 86 degrees C). Throw away any unused herb after the expiration date. ?NOTE: This sheet is a summary. It may not cover all possible information. If you have questions about this medicine, talk to your doctor, pharmacist, or health care provider. ?? 2022 Elsevier/Gold Standard (2015-10-22 00:00:00) ? ?

## 2021-09-04 ENCOUNTER — Other Ambulatory Visit: Payer: Self-pay | Admitting: Obstetrics and Gynecology

## 2021-09-04 ENCOUNTER — Ambulatory Visit
Admission: RE | Admit: 2021-09-04 | Discharge: 2021-09-04 | Disposition: A | Discharge status: Home / Self Care | Payer: BC Managed Care – PPO | Source: Ambulatory Visit | Attending: Obstetrics & Gynecology | Admitting: Obstetrics & Gynecology

## 2021-09-04 ENCOUNTER — Other Ambulatory Visit: Payer: Self-pay | Admitting: Internal Medicine

## 2021-09-04 DIAGNOSIS — Z1231 Encounter for screening mammogram for malignant neoplasm of breast: Secondary | ICD-10-CM | POA: Insufficient documentation

## 2021-09-07 ENCOUNTER — Other Ambulatory Visit: Payer: Self-pay | Admitting: Obstetrics and Gynecology

## 2021-09-07 DIAGNOSIS — N6489 Other specified disorders of breast: Secondary | ICD-10-CM

## 2021-09-07 DIAGNOSIS — R928 Other abnormal and inconclusive findings on diagnostic imaging of breast: Secondary | ICD-10-CM

## 2021-09-09 ENCOUNTER — Ambulatory Visit
Admission: RE | Admit: 2021-09-09 | Discharge: 2021-09-09 | Disposition: A | Discharge status: Home / Self Care | Payer: BC Managed Care – PPO | Source: Ambulatory Visit | Attending: Obstetrics and Gynecology | Admitting: Obstetrics and Gynecology

## 2021-09-09 DIAGNOSIS — R922 Inconclusive mammogram: Secondary | ICD-10-CM | POA: Diagnosis not present

## 2021-09-09 DIAGNOSIS — N6489 Other specified disorders of breast: Secondary | ICD-10-CM

## 2021-09-09 DIAGNOSIS — R928 Other abnormal and inconclusive findings on diagnostic imaging of breast: Secondary | ICD-10-CM

## 2021-09-10 ENCOUNTER — Encounter: Payer: Self-pay | Admitting: Obstetrics and Gynecology

## 2021-10-09 DIAGNOSIS — F1090 Alcohol use, unspecified, uncomplicated: Secondary | ICD-10-CM | POA: Diagnosis not present

## 2021-10-09 DIAGNOSIS — R202 Paresthesia of skin: Secondary | ICD-10-CM | POA: Diagnosis not present

## 2021-10-09 DIAGNOSIS — S8011XA Contusion of right lower leg, initial encounter: Secondary | ICD-10-CM | POA: Diagnosis not present

## 2021-10-09 DIAGNOSIS — R7989 Other specified abnormal findings of blood chemistry: Secondary | ICD-10-CM | POA: Diagnosis not present

## 2021-11-24 DIAGNOSIS — E538 Deficiency of other specified B group vitamins: Secondary | ICD-10-CM | POA: Diagnosis not present

## 2021-11-24 DIAGNOSIS — F1090 Alcohol use, unspecified, uncomplicated: Secondary | ICD-10-CM | POA: Diagnosis not present

## 2021-11-24 DIAGNOSIS — R7989 Other specified abnormal findings of blood chemistry: Secondary | ICD-10-CM | POA: Diagnosis not present

## 2021-12-22 DIAGNOSIS — S8012XA Contusion of left lower leg, initial encounter: Secondary | ICD-10-CM | POA: Diagnosis not present

## 2021-12-22 DIAGNOSIS — M79672 Pain in left foot: Secondary | ICD-10-CM | POA: Diagnosis not present

## 2022-01-28 NOTE — H&P (Signed)
Pre-Procedure H&P   Patient ID: Cynthia Osborn is a 46 y.o. female.  Gastroenterology Provider: Annamaria Helling, DO  Referring Provider: Dr. Tressia Miners PCP: Amalia Greenhouse  Date: 01/29/2022  HPI [name] is a 46 y.o. female who presents today for Colonoscopy for Initial screening colonoscopy.  No family history colon cancer or colon polyps.  Every other day bowel movement without melena or hematochezia.  Patient with Q65 folic acid and vitamin D deficiency.  Per notation drinks 30 drinks plus per week.  Most recent lab work hemoglobin 14 MCV 100.5 platelets 218,000 B12 118 creatinine 0.8 Iron saturation 47% TIBC 297   Past Medical History:  Diagnosis Date   Cancer ([place])    skin   Legally blind     Past Surgical History:  Procedure Laterality Date   AUGMENTATION MAMMAPLASTY Bilateral    2000   BREAST ENHANCEMENT SURGERY     CESAREAN SECTION     cyro     INTRAUTERINE DEVICE (IUD) INSERTION      Family History No h/o GI disease or malignancy  Review of Systems  Constitutional:  Negative for activity change, appetite change, chills, diaphoresis, fatigue, fever and unexpected weight change.  HENT:  Negative for trouble swallowing and voice change.   Respiratory:  Negative for shortness of breath and wheezing.   Cardiovascular:  Negative for chest pain, palpitations and leg swelling.  Gastrointestinal:  Negative for abdominal distention, abdominal pain, anal bleeding, blood in stool, constipation, diarrhea, nausea, rectal pain and vomiting.  Musculoskeletal:  Negative for arthralgias and myalgias.  Skin:  Negative for color change and pallor.  Neurological:  Negative for dizziness, syncope and weakness.  Psychiatric/Behavioral:  Negative for confusion.   All other systems reviewed and are negative.    Medications No current facility-administered medications on file prior to encounter.   Current Outpatient Medications on File Prior to  Encounter  Medication Sig Dispense Refill   cyanocobalamin (VITAMIN B12) 1000 MCG/ML injection Inject 1,000 mcg into the muscle once.     levonorgestrel (MIRENA) 20 MCG/24HR IUD 1 each by Intrauterine route once.     Ivermectin 1 % CREA Apply topically. (Patient not taking: Reported on 07/27/2021)      Pertinent medications related to GI and procedure were reviewed by me with the patient prior to the procedure   Current Facility-Administered Medications:    0.9 %  sodium chloride infusion, , Intravenous, Continuous, Annamaria Helling, DO, Last Rate: 20 mL/hr at 01/29/22 1025, 1,000 mL at 01/29/22 1025      No Known Allergies Allergies were reviewed by me prior to the procedure  Objective   Body mass index is 24.63 kg/m. Vitals:   01/29/22 1011  Pulse: 75  Resp: 18  Temp: (!) 96.4 F (35.8 C)  TempSrc: Temporal  Weight: 68.2 kg  Height: 5' 5.5" (1.664 m)     Physical Exam Vitals and nursing note reviewed.  Constitutional:      General: She is not in acute distress.    Appearance: Normal appearance. She is not ill-appearing, toxic-appearing or diaphoretic.  HENT:     Head: Normocephalic and atraumatic.     Nose: Nose normal.     Mouth/Throat:     Mouth: Mucous membranes are moist.     Pharynx: Oropharynx is clear.  Eyes:     General: No scleral icterus.    Extraocular Movements: Extraocular movements intact.  Cardiovascular:     Rate and Rhythm: Normal rate and  regular rhythm.     Heart sounds: Normal heart sounds. No murmur heard.    No friction rub. No gallop.  Pulmonary:     Effort: Pulmonary effort is normal. No respiratory distress.     Breath sounds: Normal breath sounds. No wheezing, rhonchi or rales.  Abdominal:     General: Bowel sounds are normal. There is no distension.     Palpations: Abdomen is soft.     Tenderness: There is no abdominal tenderness. There is no guarding or rebound.  Musculoskeletal:     Cervical back: Neck supple.     Right  lower leg: No edema.     Left lower leg: No edema.  Skin:    General: Skin is warm and dry.     Coloration: Skin is not jaundiced or pale.  Neurological:     General: No focal deficit present.     Mental Status: She is alert and oriented to person, place, and time. Mental status is at baseline.  Psychiatric:        Mood and Affect: Mood normal.        Behavior: Behavior normal.        Thought Content: Thought content normal.        Judgment: Judgment normal.      Assessment:  [name] is a 46 y.o. female  who presents today for Colonoscopy for initial screening colonoscopy.  Plan:  Colonoscopy with possible intervention today  Colonoscopy with possible biopsy, control of bleeding, polypectomy, and interventions as necessary has been discussed with the patient/patient representative. Informed consent was obtained from the patient/patient representative after explaining the indication, nature, and risks of the procedure including but not limited to death, bleeding, perforation, missed neoplasm/lesions, cardiorespiratory compromise, and reaction to medications. Opportunity for questions was given and appropriate answers were provided. Patient/patient representative has verbalized understanding is amenable to undergoing the procedure.   Annamaria Helling, DO  [facility]  Portions of the record may have been created with voice recognition software. Occasional wrong-word or 'sound-a-like' substitutions may have occurred due to the inherent limitations of voice recognition software.  Read the chart carefully and recognize, using context, where substitutions may have occurred.

## 2022-01-29 ENCOUNTER — Ambulatory Visit: Payer: BC Managed Care – PPO | Admitting: Anesthesiology

## 2022-01-29 ENCOUNTER — Ambulatory Visit
Admission: RE | Admit: 2022-01-29 | Discharge: 2022-01-29 | Disposition: A | Discharge status: Home / Self Care | Payer: BC Managed Care – PPO | Attending: Gastroenterology | Admitting: Gastroenterology

## 2022-01-29 ENCOUNTER — Encounter
Admission: RE | Disposition: A | Discharge status: Home / Self Care | Payer: Self-pay | Source: Home / Self Care | Attending: Gastroenterology

## 2022-01-29 ENCOUNTER — Encounter: Payer: Self-pay | Admitting: Gastroenterology

## 2022-01-29 DIAGNOSIS — H548 Legal blindness, as defined in USA: Secondary | ICD-10-CM | POA: Diagnosis not present

## 2022-01-29 DIAGNOSIS — D122 Benign neoplasm of ascending colon: Secondary | ICD-10-CM | POA: Insufficient documentation

## 2022-01-29 DIAGNOSIS — Z1211 Encounter for screening for malignant neoplasm of colon: Secondary | ICD-10-CM | POA: Insufficient documentation

## 2022-01-29 DIAGNOSIS — K635 Polyp of colon: Secondary | ICD-10-CM | POA: Insufficient documentation

## 2022-01-29 DIAGNOSIS — K621 Rectal polyp: Secondary | ICD-10-CM | POA: Insufficient documentation

## 2022-01-29 DIAGNOSIS — K5730 Diverticulosis of large intestine without perforation or abscess without bleeding: Secondary | ICD-10-CM | POA: Diagnosis not present

## 2022-01-29 DIAGNOSIS — K640 First degree hemorrhoids: Secondary | ICD-10-CM | POA: Insufficient documentation

## 2022-01-29 DIAGNOSIS — K5790 Diverticulosis of intestine, part unspecified, without perforation or abscess without bleeding: Secondary | ICD-10-CM | POA: Diagnosis not present

## 2022-01-29 DIAGNOSIS — E559 Vitamin D deficiency, unspecified: Secondary | ICD-10-CM | POA: Diagnosis not present

## 2022-01-29 DIAGNOSIS — D126 Benign neoplasm of colon, unspecified: Secondary | ICD-10-CM | POA: Diagnosis not present

## 2022-01-29 HISTORY — PX: COLONOSCOPY: SHX5424

## 2022-01-29 LAB — POCT PREGNANCY, URINE: Preg Test, Ur: NEGATIVE

## 2022-01-29 SURGERY — COLONOSCOPY
Anesthesia: General

## 2022-01-29 MED ORDER — PROPOFOL 500 MG/50ML IV EMUL
INTRAVENOUS | Status: DC | PRN
  Administered 2022-01-29: 140 ug/kg/min via INTRAVENOUS

## 2022-01-29 MED ORDER — SODIUM CHLORIDE 0.9 % IV SOLN
INTRAVENOUS | Status: DC
  Administered 2022-01-29: 1000 mL via INTRAVENOUS

## 2022-01-29 MED ORDER — LIDOCAINE HCL (CARDIAC) PF 100 MG/5ML IV SOSY
PREFILLED_SYRINGE | INTRAVENOUS | Status: DC | PRN
  Administered 2022-01-29: 60 mg via INTRAVENOUS

## 2022-01-29 MED ORDER — PROPOFOL 10 MG/ML IV BOLUS
INTRAVENOUS | Status: DC | PRN
  Administered 2022-01-29: 80 mg via INTRAVENOUS

## 2022-01-29 NOTE — Anesthesia Postprocedure Evaluation (Signed)
Anesthesia Post Note  Patient: Cynthia Osborn  Procedure(s) Performed: COLONOSCOPY  Patient location during evaluation: Endoscopy Anesthesia Type: General Level of consciousness: awake and alert Pain management: pain level controlled Vital Signs Assessment: post-procedure vital signs reviewed and stable Respiratory status: spontaneous breathing, nonlabored ventilation and respiratory function stable Cardiovascular status: blood pressure returned to baseline and stable Postop Assessment: no apparent nausea or vomiting Anesthetic complications: no   No notable events documented.   Last Vitals:  Vitals:   01/29/22 1115 01/29/22 1130  BP: (!) 93/55 (!) 99/59  Pulse: 71   Resp: 19   Temp: (!) 36.1 C   SpO2: 100% 100%    Last Pain:  Vitals:   01/29/22 1130  TempSrc:   PainSc: 0-No pain                 [place] Ouch

## 2022-01-29 NOTE — Transfer of Care (Signed)
Immediate Anesthesia Transfer of Care Note  Patient: Cynthia Osborn  Procedure(s) Performed: COLONOSCOPY  Patient Location: PACU  Anesthesia Type:General  Level of Consciousness: awake, alert  and oriented  Airway & Oxygen Therapy: Patient Spontanous Breathing  Post-op Assessment: Report given to RN and Post -op Vital signs reviewed and stable  Post vital signs: Reviewed and stable  Last Vitals:  Vitals Value Taken Time  BP    Temp    Pulse 74 01/29/22 1115  Resp 14 01/29/22 1115  SpO2 100 % 01/29/22 1115  Vitals shown include unvalidated device data.  Last Pain:  Vitals:   01/29/22 1011  TempSrc: Temporal  PainSc: 0-No pain         Complications: No notable events documented.

## 2022-01-29 NOTE — Op Note (Signed)
[facility] Patient Name: Cynthia Osborn Procedure Date: 01/29/2022 10:27 AM MRN: [id number] Account #: [id number] Date of Birth: 04/25/1976 Admit Type: Outpatient Age: 46 Room: [facility] ENDO ROOM 1 Gender: Female Note Status: Finalized Instrument Name: Colonoscope [id number] Procedure:             Colonoscopy Indications:           Screening for colorectal malignant neoplasm Providers:             Rueben Bash, DO Referring MD:          Fraser Din Medicines:             Monitored Anesthesia Care Complications:         No immediate complications. Estimated blood loss:                         Minimal. Procedure:             Pre-Anesthesia Assessment:                        - Prior to the procedure, a History and Physical was                         performed, and patient medications and allergies were                         reviewed. The patient is competent. The risks and                         benefits of the procedure and the sedation options and                         risks were discussed with the patient. All questions                         were answered and informed consent was obtained.                         Patient identification and proposed procedure were                         verified by the physician, the nurse, the anesthetist                         and the technician in the endoscopy suite. Mental                         Status Examination: alert and oriented. Airway                         Examination: normal oropharyngeal airway and neck                         mobility. Respiratory Examination: clear to                         auscultation. CV Examination: RRR, no murmurs, no S3  or S4. Prophylactic Antibiotics: The patient does not                         require prophylactic antibiotics. Prior                         Anticoagulants: The patient has taken no previous                          anticoagulant or antiplatelet agents. ASA Grade                         Assessment: II - A patient with mild systemic disease.                         After reviewing the risks and benefits, the patient                         was deemed in satisfactory condition to undergo the                         procedure. The anesthesia plan was to use monitored                         anesthesia care (MAC). Immediately prior to                         administration of medications, the patient was                         re-assessed for adequacy to receive sedatives. The                         heart rate, respiratory rate, oxygen saturations,                         blood pressure, adequacy of pulmonary ventilation, and                         response to care were monitored throughout the                         procedure. The physical status of the patient was                         re-assessed after the procedure.                        After obtaining informed consent, the colonoscope was                         passed under direct vision. Throughout the procedure,                         the patient's blood pressure, pulse, and oxygen                         saturations were monitored continuously. The  Colonoscope was introduced through the anus and                         advanced to the the cecum, identified by appendiceal                         orifice and ileocecal valve. The colonoscopy was                         performed without difficulty. The patient tolerated                         the procedure well. The quality of the bowel                         preparation was evaluated using the BBPS [facility] Bowel                         Preparation Scale) with scores of: Right Colon = 3,                         Transverse Colon = 3 and Left Colon = 3 (entire mucosa                         seen well with no residual staining, small fragments                          of stool or opaque liquid). The total BBPS score                         equals 9. The ileocecal valve, appendiceal orifice,                         and rectum were photographed. Findings:      The perianal and digital rectal examinations were normal. Pertinent       negatives include normal sphincter tone.      Scattered small-mouthed diverticula were found in the entire colon. Few.       Estimated blood loss: none.      Two sessile polyps were found in the rectum. The polyps were 1 to 2 mm       in size. These polyps were removed with a jumbo cold forceps. Resection       and retrieval were complete. Estimated blood loss was minimal.      A 4 to 5 mm polyp was found in the ascending colon. The polyp was       sessile. The polyp was removed with a cold snare. Resection and       retrieval were complete. Estimated blood loss was minimal.      Non-bleeding internal hemorrhoids were found during retroflexion. The       hemorrhoids were Grade I (internal hemorrhoids that do not prolapse).       Estimated blood loss: none.      The exam was otherwise without abnormality on direct and retroflexion       views. Impression:            - Mild diverticulosis in the entire examined colon.  Few.                        - Two 1 to 2 mm polyps in the rectum, removed with a                         jumbo cold forceps. Resected and retrieved.                        - One 4 to 5 mm polyp in the ascending colon, removed                         with a cold snare. Resected and retrieved.                        - Non-bleeding internal hemorrhoids.                        - The examination was otherwise normal on direct and                         retroflexion views. Recommendation:        - Patient has a contact number available for                         emergencies. The signs and symptoms of potential                         delayed complications were discussed with the patient.                          Return to normal activities tomorrow. Written                         discharge instructions were provided to the patient.                        - Discharge patient to home.                        - Resume previous diet.                        - Continue present medications.                        - No aspirin, ibuprofen, naproxen, or other                         non-steroidal anti-inflammatory drugs for 5 days after                         polyp removal.                        - Await pathology results.                        - Repeat colonoscopy for surveillance based on  pathology results.                        - Return to referring physician as previously                         scheduled.                        - The findings and recommendations were discussed with                         the patient. Procedure Code(s):     --- Professional ---                        [id number], Colonoscopy, flexible; with removal of                         tumor(s), polyp(s), or other lesion(s) by snare                         technique                        45380, 35, Colonoscopy, flexible; with biopsy, single                         or multiple Diagnosis Code(s):     --- Professional ---                        Z12.11, Encounter for screening for malignant neoplasm                         of colon                        K64.0, First degree hemorrhoids                        K62.1, Rectal polyp                        K63.5, Polyp of colon CPT copyright 2019 American Medical Association. All rights reserved. The codes documented in this report are preliminary and upon coder review may  be revised to meet current compliance requirements. Attending Participation:      I personally performed the entire procedure. Volney American, DO Annamaria Helling DO, DO 01/29/2022 11:17:33 AM This report has been signed electronically. Number of Addenda: 0 Note  Initiated On: 01/29/2022 10:27 AM Scope Withdrawal Time: 0 hours 16 minutes 1 second  Total Procedure Duration: 0 hours 28 minutes 37 seconds  Estimated Blood Loss:  Estimated blood loss was minimal.      [facility]

## 2022-01-29 NOTE — Anesthesia Preprocedure Evaluation (Addendum)
Anesthesia Evaluation  Patient identified by MRN, date of birth, ID band Patient awake    Reviewed: Allergy & Precautions, NPO status , Patient's Chart, lab work & pertinent test results  Airway Mallampati: II  TM Distance: >3 FB Neck ROM: full    Dental no notable dental hx.    Pulmonary neg pulmonary ROS,    Pulmonary exam normal        Cardiovascular negative cardio ROS Normal cardiovascular exam     Neuro/Psych Legally blind negative psych ROS   GI/Hepatic negative GI ROS, Neg liver ROS,   Endo/Other  negative endocrine ROS  Renal/GU negative Renal ROS  negative genitourinary   Musculoskeletal   Abdominal   Peds  Hematology negative hematology ROS (+)   Anesthesia Other Findings Past Medical History: No date: Cancer (Hurt)     Comment:  skin No date: Legally blind  Past Surgical History: No date: AUGMENTATION MAMMAPLASTY; Bilateral     Comment:  2000 No date: BREAST ENHANCEMENT SURGERY No date: CESAREAN SECTION No date: cyro No date: INTRAUTERINE DEVICE (IUD) INSERTION     Reproductive/Obstetrics negative OB ROS                            Anesthesia Physical Anesthesia Plan  ASA: 2  Anesthesia Plan: General   Post-op Pain Management:    Induction: Intravenous  PONV Risk Score and Plan: Propofol infusion and TIVA  Airway Management Planned: Natural Airway  Additional Equipment:   Intra-op Plan:   Post-operative Plan:   Informed Consent: I have reviewed the patients History and Physical, chart, labs and discussed the procedure including the risks, benefits and alternatives for the proposed anesthesia with the patient or authorized representative who has indicated his/her understanding and acceptance.     Dental Advisory Given  Plan Discussed with: Anesthesiologist, CRNA and Surgeon  Anesthesia Plan Comments:        Anesthesia Quick Evaluation

## 2022-01-29 NOTE — Interval H&P Note (Signed)
History and Physical Interval Note: Preprocedure H&P from 01/29/22  was reviewed and there was no interval change after seeing and examining the patient.  Written consent was obtained from the patient after discussion of risks, benefits, and alternatives. Patient has consented to proceed with Colonoscopy with possible intervention   01/29/2022 10:32 AM  Cynthia Osborn  has presented today for surgery, with the diagnosis of Colon cancer screening (Z12.11).  The various methods of treatment have been discussed with the patient and family. After consideration of risks, benefits and other options for treatment, the patient has consented to  Procedure(s): COLONOSCOPY (N/A) as a surgical intervention.  The patient's history has been reviewed, patient examined, no change in status, stable for surgery.  I have reviewed the patient's chart and labs.  Questions were answered to the patient's satisfaction.     Annamaria Helling

## 2022-02-01 ENCOUNTER — Encounter: Payer: Self-pay | Admitting: Gastroenterology

## 2022-02-01 LAB — SURGICAL PATHOLOGY

## 2022-02-28 ENCOUNTER — Other Ambulatory Visit: Payer: Self-pay

## 2022-02-28 ENCOUNTER — Emergency Department: Payer: BC Managed Care – PPO

## 2022-02-28 ENCOUNTER — Emergency Department
Admission: EM | Admit: 2022-02-28 | Discharge: 2022-02-28 | Disposition: A | Discharge status: Home / Self Care | Payer: BC Managed Care – PPO | Attending: Emergency Medicine | Admitting: Emergency Medicine

## 2022-02-28 DIAGNOSIS — R002 Palpitations: Secondary | ICD-10-CM | POA: Diagnosis not present

## 2022-02-28 DIAGNOSIS — R0789 Other chest pain: Secondary | ICD-10-CM | POA: Insufficient documentation

## 2022-02-28 LAB — CBC
HCT: 38.9 % (ref 36.0–46.0)
Hemoglobin: 13.1 g/dL (ref 12.0–15.0)
MCH: 32.3 pg (ref 26.0–34.0)
MCHC: 33.7 g/dL (ref 30.0–36.0)
MCV: 95.8 fL (ref 80.0–100.0)
Platelets: 176 10*3/uL (ref 150–400)
RBC: 4.06 MIL/uL (ref 3.87–5.11)
RDW: 11.9 % (ref 11.5–15.5)
WBC: 4.9 10*3/uL (ref 4.0–10.5)
nRBC: 0 % (ref 0.0–0.2)

## 2022-02-28 LAB — BASIC METABOLIC PANEL
Anion gap: 7 (ref 5–15)
BUN: 9 mg/dL (ref 6–20)
CO2: 27 mmol/L (ref 22–32)
Calcium: 9.3 mg/dL (ref 8.9–10.3)
Chloride: 105 mmol/L (ref 98–111)
Creatinine, Ser: 0.85 mg/dL (ref 0.44–1.00)
GFR, Estimated: >60 mL/min (ref >60)
Glucose, Bld: 154 mg/dL — ABNORMAL HIGH (ref 70–99)
Potassium: 4 mmol/L (ref 3.5–5.1)
Sodium: 139 mmol/L (ref 135–145)

## 2022-02-28 LAB — T4, FREE: Free T4: 0.63 ng/dL (ref 0.61–1.12)

## 2022-02-28 LAB — TROPONIN I (HIGH SENSITIVITY)
Troponin I (High Sensitivity): <2 ng/L (ref <18)
Troponin I (High Sensitivity): 3 ng/L (ref <18)

## 2022-02-28 LAB — TSH: TSH: 0.885 u[IU]/mL (ref 0.350–4.500)

## 2022-02-28 NOTE — ED Triage Notes (Signed)
Pt to ED from home POV. Pt complains of chest pressure since Friday (2d), and lots of palpitations since 3 days ago "like a constant flluttering with an occasional punch". Also L neck feels "like I pulled something" and was nauseous this morning at church.

## 2022-02-28 NOTE — ED Provider Notes (Signed)
[facility] Provider Note    Event Date/Time   First MD Initiated Contact with Patient 02/28/22 1442     (approximate)   History   Chief Complaint Chest Pain   HPI  Cynthia Osborn is a 46 y.o. female with no significant past medical history who presents to the ED complaining of chest pain.  Patient reports that she has been dealing with intermittent palpitations for about the past 6 months, ever since she had COVID-19.  She states that over the past 2 days the palpitations have been more intense and associated with occasional "heaviness" in her chest, which she describes as feeling like she had been punched.  She denies any associated difficulty breathing and has not had any fevers or cough.  She has not noticed any pain or swelling in her legs.  She reports being seen by her PCP for this problem previously, but has never seen a cardiologist.  She currently denies any pain in her chest.  She denies significant caffeine intake.     Physical Exam   Triage Vital Signs: ED Triage Vitals  Enc Vitals Group     BP 02/28/22 1327 121/60     Pulse Rate 02/28/22 1327 71     Resp 02/28/22 1327 15     Temp 02/28/22 1327 98 F (36.7 C)     Temp Source 02/28/22 1327 Oral     SpO2 02/28/22 1327 100 %     Weight 02/28/22 1328 150 lb (68 kg)     Height 02/28/22 1328 '5\' 5"'$  (1.651 m)     Head Circumference --      Peak Flow --      Pain Score 02/28/22 1328 4     Pain Loc --      Pain Edu? --      Excl. in [place]? --     Most recent vital signs: Vitals:   02/28/22 1600 02/28/22 1630  BP: 115/62 113/65  Pulse: 65 65  Resp: 19 10  Temp:    SpO2: 100% 99%    Constitutional: Alert and oriented. Eyes: Conjunctivae are normal. Head: Atraumatic. Nose: No congestion/rhinnorhea. Mouth/Throat: Mucous membranes are moist.  Cardiovascular: Normal rate, regular rhythm. Grossly normal heart sounds.  2+ radial pulses bilaterally. Respiratory: Normal respiratory effort.   No retractions. Lungs CTAB. Gastrointestinal: Soft and nontender. No distention. Musculoskeletal: No lower extremity tenderness nor edema.  Neurologic:  Normal speech and language. No gross focal neurologic deficits are appreciated.    ED Results / Procedures / Treatments   Labs (all labs ordered are listed, but only abnormal results are displayed) Labs Reviewed  BASIC METABOLIC PANEL - Abnormal; Notable for the following components:      Result Value   Glucose, Bld 154 (*)    All other components within normal limits  CBC  TSH  T4, FREE  TROPONIN I (HIGH SENSITIVITY)  TROPONIN I (HIGH SENSITIVITY)     EKG  ED ECG REPORT I, Blake Divine, the attending physician, personally viewed and interpreted this ECG.   Date: 02/28/2022  EKG Time: 13:19  Rate: 77  Rhythm: normal sinus rhythm  Axis: Normal  Intervals:none  ST&T Change: None  RADIOLOGY Chest x-ray reviewed and interpreted by me with no infiltrate, edema, or effusion.  PROCEDURES:  Critical Care performed: No  Procedures   MEDICATIONS ORDERED IN ED: Medications - No data to display   IMPRESSION / MDM / [place] / ED COURSE  I reviewed  the triage vital signs and the nursing notes.                              46 y.o. female with no significant past medical history presents to the ED complaining of intermittent palpitations and chest discomfort for the past 6 months, more severe over the past 2 days.  Patient's presentation is most consistent with acute presentation with potential threat to life or bodily function.  Differential diagnosis includes, but is not limited to, arrhythmia, ACS, PE, electrolyte abnormality, AKI, anxiety.  Patient nontoxic-appearing and in no acute distress, vital signs are unremarkable.  EKG shows normal sinus rhythm with no ischemic changes and initial troponin is negative.  I have low suspicion for ACS and we will observe on cardiac monitor here briefly in the ED.   Remainder of labs are reassuring with no significant anemia, leukocytosis, electrolyte abnormality, or AKI.  With no chest pain or shortness of breath currently and reassuring vital signs, I doubt PE.  We will add on thyroid studies and recheck troponin, if these are unremarkable patient would be appropriate for outpatient cardiology follow-up for potential Holter monitor placement.  No events noted on cardiac monitor and repeat troponin within normal limits, thyroid studies also unremarkable.  Patient is appropriate for discharge home with outpatient follow-up with cardiology, was counseled to return to the ED for new or worsening symptoms, patient agrees with plan.      FINAL CLINICAL IMPRESSION(S) / ED DIAGNOSES   Final diagnoses:  Palpitations  Atypical chest pain     Rx / DC Orders   ED Discharge Orders          Ordered    Ambulatory referral to Cardiology        02/28/22 1745             Note:  This document was prepared using Dragon voice recognition software and may include unintentional dictation errors.   Blake Divine, MD 02/28/22 1745

## 2022-02-28 NOTE — ED Notes (Signed)
Trop sent to lab

## 2022-03-05 ENCOUNTER — Ambulatory Visit: Payer: BC Managed Care – PPO | Attending: Cardiology | Admitting: Cardiology

## 2022-03-05 ENCOUNTER — Ambulatory Visit (INDEPENDENT_AMBULATORY_CARE_PROVIDER_SITE_OTHER): Payer: BC Managed Care – PPO

## 2022-03-05 ENCOUNTER — Encounter: Payer: Self-pay | Admitting: Cardiology

## 2022-03-05 VITALS — BP 102/64 | HR 73 | Ht 65.0 in | Wt 154.4 lb

## 2022-03-05 DIAGNOSIS — R072 Precordial pain: Secondary | ICD-10-CM | POA: Diagnosis not present

## 2022-03-05 DIAGNOSIS — R002 Palpitations: Secondary | ICD-10-CM | POA: Diagnosis not present

## 2022-03-05 MED ORDER — IVABRADINE HCL 5 MG PO TABS: 10.0000 mg | ORAL_TABLET | Freq: Once | ORAL | 0 refills | Status: AC

## 2022-03-05 MED ORDER — METOPROLOL TARTRATE 100 MG PO TABS: 100.0000 mg | ORAL_TABLET | Freq: Once | ORAL | 0 refills | Status: DC

## 2022-03-05 NOTE — Patient Instructions (Signed)
Medication Instructions:   Your physician recommends that you continue on your current medications as directed. Please refer to the Current Medication list given to you today.   *If you need a refill on your cardiac medications before your next appointment, please call your pharmacy*   Testing/Procedures:  Your physician has requested that you have an echocardiogram. Echocardiography is a painless test that uses sound waves to create images of your heart. It provides your doctor with information about the size and shape of your heart and how well your heart's chambers and valves are working. This procedure takes approximately one hour. There are no restrictions for this procedure. Please do NOT wear cologne, perfume, aftershave, or lotions (deodorant is allowed). Please arrive 15 minutes prior to your appointment time.    2.    Your physician has requested that you have cardiac CT. Cardiac computed tomography (CT) is a painless test that uses an x-ray machine to take clear, detailed pictures of your heart.    Your cardiac CT will be scheduled at:  [facility] [place], [place] [address] [phone]   Monday 03/22/22 at 1:00 PM  Please arrive 15 mins early for check-in and test prep.    Please follow these instructions carefully (unless otherwise directed):   Night Before the Test: Be sure to Drink plenty of water. Do not consume any caffeinated/decaffeinated beverages or chocolate 12 hours prior to your test.   On the Day of the Test: Drink plenty of water until 1 hour prior to the test. Do not eat any food 4 hours prior to the test. You may take your regular medications prior to the test.  Take metoprolol (Lopressor) 100 MG two hours prior to test. Take Ivabradine (Corlanor) 10 MG two hours prior to test. FEMALES- please wear underwire-free bra if available, avoid dresses & tight clothing       After the Test: Drink plenty of  water. After receiving IV contrast, you may experience a mild flushed feeling. This is normal. On occasion, you may experience a mild rash up to 24 hours after the test. This is not dangerous. If this occurs, you can take Benadryl 25 mg and increase your fluid intake. If you experience trouble breathing, this can be serious. If it is severe call 911 IMMEDIATELY. If it is mild, please call our office. If you take any of these medications: Glipizide/Metformin, Avandament, Glucavance, please do not take 48 hours after completing test unless otherwise instructed.  Please allow 2-4 weeks for scheduling of routine cardiac CTs. Some insurance companies require a pre-authorization which may delay scheduling of this test.   For non-scheduling related questions, please contact the cardiac imaging nurse navigator should you have any questions/concerns: Marchia Bond, Cardiac Imaging Nurse Navigator Gordy Clement, Cardiac Imaging Nurse Navigator [facility] Direct Office Dial: [phone]   For scheduling needs, including cancellations and rescheduling, please call [place], [phone].    3.   Your physician has recommended that you wear a Zio XT monitor for 2 weeks. This will be mailed to your home address in 4-5 business days.   Your clinician has requested a Zio heart rhythm monitor by [employer] to be mailed to your home for you to wear for 14 days. You should expect a small box to arrive via USPS (or FedEx in some cases) within this next week. If you do not receive it please call [employer] at [phone].  Closely watching your heart at  this time will help your care team understand more and provide information needed to develop your plan of care.  Please apply your Zio patch monitor the day you receive it. Keep this packaging, you will use this to return your Zio monitor.  You will easily be able to apply the monitor with the instructions provided in the Patient  Guide.  If you need assistance, [employer] representatives are available 24/7 at [phone].  You can also download the [facility] app on your phone to view detailed application instructions and log symptoms.  After you wear your monitor for 14 days, place it back in the blue box or envelope, along with your Symptom Log.  To send your monitor back: Simply use the pre-addressed and pre-paid box/envelope.  Send it back through [employer] the same day you remove it via your local post office or by placing it in your mailbox.  As soon as we receive the results, they will be reviewed and your clinician will contact you.  For the first 24 hours- it is essential to not shower or exercise, to allow the patch to adhere to your skin. Avoid excessive sweating to help maximize wear time. Do not submerge the device, no hot tubs, and no swimming pools. Keep any lotions or oils away from the patch. After 24 hours you may shower with the patch on. Take brief showers with your back facing the shower head.  Do not remove patch once it has been placed because that will interrupt data and decrease adhesive wear time. Push the button when you have any symptoms and write down what you were feeling. Once you have completed wearing your monitor, remove and place into box which has postage paid and place in your outgoing mailbox.  If for some reason you have misplaced your box then call our office and we can provide another box and/or mail it off for you.  Follow-Up: At [facility], you and your health needs are our priority.  As part of our continuing mission to provide you with exceptional heart care, we have created designated Provider Care Teams.  These Care Teams include your primary Cardiologist (physician) and Advanced Practice Providers (APPs -  Physician Assistants and Nurse Practitioners) who all work together to provide you with the care you need, when you need it.  We recommend signing up for the  patient portal called "MyChart".  Sign up information is provided on this After Visit Summary.  MyChart is used to connect with patients for Virtual Visits (Telemedicine).  Patients are able to view lab/test results, encounter notes, upcoming appointments, etc.  Non-urgent messages can be sent to your provider as well.   To learn more about what you can do with MyChart, go to [email or web address].    Your next appointment:   6-8 week(s)  The format for your next appointment:   In Person  Provider:   You may see Kate Sable, MD or one of the following Advanced Practice Providers on your designated Care Team:   Murray Hodgkins, NP Christell Faith, PA-C Cadence Kathlen Mody, PA-C Gerrie Nordmann, NP    Other Instructions   Important Information About Sugar

## 2022-03-05 NOTE — Progress Notes (Signed)
Cardiology Office Note:    Date:  03/05/2022   ID:  Corliss Parish, DOB May 14, 1975, MRN [id number]  PCP:  Gladstone Lighter, MD   [facility] Cardiologist:  None     Referring MD: Gladstone Lighter, MD   Chief Complaint  Patient presents with   New Patient (Initial Visit)    Palpitations, since having covid May 2022, legs weak    Cynthia Osborn is a 46 y.o. female who is being seen today for the evaluation of palpitations at the request of Gladstone Lighter, MD.  History of Present Illness:    Cynthia Osborn is a 45 y.o. female, former smoker x10 years who presents with palpitations and chest tightness.  Had COVID-19 infection 6 months ago, since then she has noticed palpitations usually in the morning lasting a few seconds.  Denies dizziness or syncope.  Sometimes she develops chest tightness not associated with exertion.  Denies any immediate family history of heart disease or heart attacks.  Takes no medications, otherwise feels healthy.  Presented to the ED last week due to chest tightness and palpitations.  Work-up was unrevealing.  Past Medical History:  Diagnosis Date   Cancer ([place])    skin   Legally blind     Past Surgical History:  Procedure Laterality Date   AUGMENTATION MAMMAPLASTY Bilateral    2000   BREAST ENHANCEMENT SURGERY     CESAREAN SECTION     COLONOSCOPY N/A 01/29/2022   Procedure: COLONOSCOPY;  Surgeon: Annamaria Helling, DO;  Location: [facility] ENDOSCOPY;  Service: Gastroenterology;  Laterality: N/A;   cyro     INTRAUTERINE DEVICE (IUD) INSERTION      Current Medications: Current Meds  Medication Sig   Cholecalciferol (D 1000) 25 MCG (1000 UT) capsule Take 1,000 Units by mouth daily.   cyanocobalamin (VITAMIN B12) 1000 MCG/ML injection Inject 1,000 mcg into the muscle once.   ivabradine (CORLANOR) 5 MG TABS tablet Take 2 tablets (10 mg total) by mouth once for 1 dose. Take 2 hours prior to your CT Scan.    levonorgestrel (MIRENA) 20 MCG/24HR IUD 1 each by Intrauterine route once.   metoprolol tartrate (LOPRESSOR) 100 MG tablet Take 1 tablet (100 mg total) by mouth once for 1 dose. Take 2 hours prior to your CT scan.     Allergies:   Patient has no known allergies.   Social History   Socioeconomic History   Marital status: Married    Spouse name: Not on file   Number of children: Not on file   Years of education: Not on file   Highest education level: Not on file  Occupational History   Not on file  Tobacco Use   Smoking status: Former    Packs/day: 0.50    Types: Cigarettes    Quit date: 04/26/1996    Years since quitting: 25.8   Smokeless tobacco: Never  Vaping Use   Vaping Use: Never used  Substance and Sexual Activity   Alcohol use: Yes    Alcohol/week: 4.0 standard drinks of alcohol    Types: 4 Glasses of wine per week   Drug use: No   Sexual activity: Yes    Birth control/protection: I.U.D.    Comment: Mirena  Other Topics Concern   Not on file  Social History Narrative   Not on file   Social Determinants of Health   Financial Resource Strain: Not on file  Food Insecurity: Not on file  Transportation Needs: Not on file  Physical  Activity: Not on file  Stress: Not on file  Social Connections: Not on file     Family History: The patient's family history includes Breast cancer in her maternal aunt; Diabetes in her maternal aunt and maternal grandmother.  ROS:   Please see the history of present illness.     All other systems reviewed and are negative.  EKGs/Labs/Other Studies Reviewed:    The following studies were reviewed today:   EKG:  EKG is  ordered today.  The ekg ordered today demonstrates normal sinus rhythm, normal ECG  Recent Labs: 02/28/2022: BUN 9; Creatinine, Ser 0.85; Hemoglobin 13.1; Platelets 176; Potassium 4.0; Sodium 139; TSH 0.885  Recent Lipid Panel No results found for: "CHOL", "TRIG", "HDL", "CHOLHDL", "VLDL", "LDLCALC",  "LDLDIRECT"   Risk Assessment/Calculations:             Physical Exam:    VS:  BP 102/64 (BP Location: Right Arm, Patient Position: Sitting)   Pulse 73   Ht '5\' 5"'$  (1.651 m)   Wt 154 lb 6.4 oz (70 kg)   SpO2 98%   BMI 25.69 kg/m     Wt Readings from Last 3 Encounters:  03/05/22 154 lb 6.4 oz (70 kg)  02/28/22 150 lb (68 kg)  01/29/22 150 lb 5 oz (68.2 kg)     GEN:  Well nourished, well developed in no acute distress HEENT: Normal NECK: No JVD; No carotid bruits CARDIAC: RRR, no murmurs, rubs, gallops RESPIRATORY:  Clear to auscultation without rales, wheezing or rhonchi  ABDOMEN: Soft, non-tender, non-distended MUSCULOSKELETAL:  No edema; No deformity  SKIN: Warm and dry NEUROLOGIC:  Alert and oriented x 3 PSYCHIATRIC:  Normal affect   ASSESSMENT:    1. Palpitations   2. Precordial pain    PLAN:    In order of problems listed above:  Palpitations, place cardiac monitor x2 weeks to evaluate any significant arrhythmias. Chest pain, former smoker x10 years.  Get echo, get coronary CTA.  Follow-up after cardiac testing.      Medication Adjustments/Labs and Tests Ordered: Current medicines are reviewed at length with the patient today.  Concerns regarding medicines are outlined above.  Orders Placed This Encounter  Procedures   CT CORONARY MORPH W/CTA COR W/SCORE W/CA W/CM &/OR WO/CM   LONG TERM MONITOR (3-14 DAYS)   EKG 12-Lead   ECHOCARDIOGRAM COMPLETE   Meds ordered this encounter  Medications   metoprolol tartrate (LOPRESSOR) 100 MG tablet    Sig: Take 1 tablet (100 mg total) by mouth once for 1 dose. Take 2 hours prior to your CT scan.    Dispense:  1 tablet    Refill:  0   ivabradine (CORLANOR) 5 MG TABS tablet    Sig: Take 2 tablets (10 mg total) by mouth once for 1 dose. Take 2 hours prior to your CT Scan.    Dispense:  2 tablet    Refill:  0    Patient Instructions  Medication Instructions:   Your physician recommends that you continue on  your current medications as directed. Please refer to the Current Medication list given to you today.   *If you need a refill on your cardiac medications before your next appointment, please call your pharmacy*   Testing/Procedures:  Your physician has requested that you have an echocardiogram. Echocardiography is a painless test that uses sound waves to create images of your heart. It provides your doctor with information about the size and shape of your heart and how well  your heart's chambers and valves are working. This procedure takes approximately one hour. There are no restrictions for this procedure. Please do NOT wear cologne, perfume, aftershave, or lotions (deodorant is allowed). Please arrive 15 minutes prior to your appointment time.    2.    Your physician has requested that you have cardiac CT. Cardiac computed tomography (CT) is a painless test that uses an x-ray machine to take clear, detailed pictures of your heart.    Your cardiac CT will be scheduled at:  [facility] [place], [place] [address] [phone]   Monday 03/22/22 at 1:00 PM  Please arrive 15 mins early for check-in and test prep.    Please follow these instructions carefully (unless otherwise directed):   Night Before the Test: Be sure to Drink plenty of water. Do not consume any caffeinated/decaffeinated beverages or chocolate 12 hours prior to your test.   On the Day of the Test: Drink plenty of water until 1 hour prior to the test. Do not eat any food 4 hours prior to the test. You may take your regular medications prior to the test.  Take metoprolol (Lopressor) 100 MG two hours prior to test. Take Ivabradine (Corlanor) 10 MG two hours prior to test. FEMALES- please wear underwire-free bra if available, avoid dresses & tight clothing       After the Test: Drink plenty of water. After receiving IV contrast, you may experience a mild flushed  feeling. This is normal. On occasion, you may experience a mild rash up to 24 hours after the test. This is not dangerous. If this occurs, you can take Benadryl 25 mg and increase your fluid intake. If you experience trouble breathing, this can be serious. If it is severe call 911 IMMEDIATELY. If it is mild, please call our office. If you take any of these medications: Glipizide/Metformin, Avandament, Glucavance, please do not take 48 hours after completing test unless otherwise instructed.  Please allow 2-4 weeks for scheduling of routine cardiac CTs. Some insurance companies require a pre-authorization which may delay scheduling of this test.   For non-scheduling related questions, please contact the cardiac imaging nurse navigator should you have any questions/concerns: Marchia Bond, Cardiac Imaging Nurse Navigator Gordy Clement, Cardiac Imaging Nurse Navigator [facility] Direct Office Dial: [phone]   For scheduling needs, including cancellations and rescheduling, please call [place], [phone].    3.   Your physician has recommended that you wear a Zio XT monitor for 2 weeks. This will be mailed to your home address in 4-5 business days.   Your clinician has requested a Zio heart rhythm monitor by [employer] to be mailed to your home for you to wear for 14 days. You should expect a small box to arrive via USPS (or FedEx in some cases) within this next week. If you do not receive it please call [employer] at [phone].  Closely watching your heart at this time will help your care team understand more and provide information needed to develop your plan of care.  Please apply your Zio patch monitor the day you receive it. Keep this packaging, you will use this to return your Zio monitor.  You will easily be able to apply the monitor with the instructions provided in the Patient Guide.  If you need assistance, [employer] representatives are available 24/7  at [phone].  You can also download the [facility] app on your phone to view detailed application instructions and  log symptoms.  After you wear your monitor for 14 days, place it back in the blue box or envelope, along with your Symptom Log.  To send your monitor back: Simply use the pre-addressed and pre-paid box/envelope.  Send it back through [employer] the same day you remove it via your local post office or by placing it in your mailbox.  As soon as we receive the results, they will be reviewed and your clinician will contact you.  For the first 24 hours- it is essential to not shower or exercise, to allow the patch to adhere to your skin. Avoid excessive sweating to help maximize wear time. Do not submerge the device, no hot tubs, and no swimming pools. Keep any lotions or oils away from the patch. After 24 hours you may shower with the patch on. Take brief showers with your back facing the shower head.  Do not remove patch once it has been placed because that will interrupt data and decrease adhesive wear time. Push the button when you have any symptoms and write down what you were feeling. Once you have completed wearing your monitor, remove and place into box which has postage paid and place in your outgoing mailbox.  If for some reason you have misplaced your box then call our office and we can provide another box and/or mail it off for you.  Follow-Up: At [facility], you and your health needs are our priority.  As part of our continuing mission to provide you with exceptional heart care, we have created designated Provider Care Teams.  These Care Teams include your primary Cardiologist (physician) and Advanced Practice Providers (APPs -  Physician Assistants and Nurse Practitioners) who all work together to provide you with the care you need, when you need it.  We recommend signing up for the patient portal called "MyChart".  Sign up information is provided on this  After Visit Summary.  MyChart is used to connect with patients for Virtual Visits (Telemedicine).  Patients are able to view lab/test results, encounter notes, upcoming appointments, etc.  Non-urgent messages can be sent to your provider as well.   To learn more about what you can do with MyChart, go to [email or web address].    Your next appointment:   6-8 week(s)  The format for your next appointment:   In Person  Provider:   You may see Kate Sable, MD or one of the following Advanced Practice Providers on your designated Care Team:   Murray Hodgkins, NP Christell Faith, PA-C Cadence Kathlen Mody, PA-C Gerrie Nordmann, NP    Other Instructions   Important Information About Sugar         Signed, Kate Sable, MD  03/05/2022 11:14 AM    [place]

## 2022-03-08 DIAGNOSIS — R002 Palpitations: Secondary | ICD-10-CM

## 2022-03-10 ENCOUNTER — Encounter: Payer: Self-pay | Admitting: Cardiology

## 2022-03-11 ENCOUNTER — Encounter: Payer: Self-pay | Admitting: Family Medicine

## 2022-03-19 ENCOUNTER — Telehealth (HOSPITAL_COMMUNITY): Payer: Self-pay | Admitting: *Deleted

## 2022-03-19 NOTE — Telephone Encounter (Signed)
Reaching out to patient to offer assistance regarding upcoming cardiac imaging study; pt verbalizes understanding of appt date/time, parking situation and where to check in, medications ordered, and verified current allergies; name and call back number provided for further questions should they arise  Gordy Clement RN Navigator Cardiac Imaging [facility] [phone] office [phone] cell  Patient to take '100mg'$  metoprolol tartrate and '10mg'$  ivabradine two hours prior to her cardiac CT scan.

## 2022-03-22 ENCOUNTER — Ambulatory Visit
Admission: RE | Admit: 2022-03-22 | Discharge: 2022-03-22 | Disposition: A | Discharge status: Home / Self Care | Payer: BC Managed Care – PPO | Source: Ambulatory Visit | Attending: Cardiology | Admitting: Cardiology

## 2022-03-22 DIAGNOSIS — R072 Precordial pain: Secondary | ICD-10-CM | POA: Insufficient documentation

## 2022-03-22 MED ORDER — NITROGLYCERIN 0.4 MG SL SUBL
0.8000 mg | SUBLINGUAL_TABLET | Freq: Once | SUBLINGUAL | Status: AC
  Administered 2022-03-22: 0.8 mg via SUBLINGUAL

## 2022-03-22 MED ORDER — IOHEXOL 350 MG/ML SOLN
100.0000 mL | Freq: Once | INTRAVENOUS | Status: AC | PRN
  Administered 2022-03-22: 100 mL via INTRAVENOUS

## 2022-03-22 NOTE — Progress Notes (Signed)
Patient tolerated procedure well. Ambulate w/o difficulty. Denies any lightheadedness or being dizzy. Pt denies any pain at this time. Sitting in chair, pt is encouraged to drink additional water throughout the day and reason explained to patient. Patient verbalized understanding and all questions answered. ABC intact. No further needs at this time. Discharge from procedure area w/o issues.  

## 2022-04-20 DIAGNOSIS — J019 Acute sinusitis, unspecified: Secondary | ICD-10-CM | POA: Diagnosis not present

## 2022-04-20 DIAGNOSIS — R0981 Nasal congestion: Secondary | ICD-10-CM | POA: Diagnosis not present

## 2022-04-20 DIAGNOSIS — R058 Other specified cough: Secondary | ICD-10-CM | POA: Diagnosis not present

## 2022-04-28 ENCOUNTER — Ambulatory Visit: Payer: BC Managed Care – PPO | Attending: Cardiology

## 2022-04-28 DIAGNOSIS — R072 Precordial pain: Secondary | ICD-10-CM | POA: Diagnosis not present

## 2022-04-28 LAB — ECHOCARDIOGRAM COMPLETE
AR max vel: 2.93 cm2
AV Area VTI: 3.13 cm2
AV Area mean vel: 2.97 cm2
AV Mean grad: 3 mmHg
AV Peak grad: 4.4 mmHg
Ao pk vel: 1.05 m/s
Area-P 1/2: 3.61 cm2
Calc EF: 54 %
S' Lateral: 3.1 cm
Single Plane A2C EF: 55.7 %
Single Plane A4C EF: 52.2 %

## 2022-04-30 ENCOUNTER — Ambulatory Visit: Payer: BC Managed Care – PPO | Attending: Cardiology | Admitting: Cardiology

## 2022-04-30 ENCOUNTER — Encounter: Payer: Self-pay | Admitting: Cardiology

## 2022-04-30 VITALS — BP 120/70 | HR 83 | Ht 65.0 in | Wt 156.4 lb

## 2022-04-30 DIAGNOSIS — R002 Palpitations: Secondary | ICD-10-CM

## 2022-04-30 DIAGNOSIS — R072 Precordial pain: Secondary | ICD-10-CM | POA: Diagnosis not present

## 2022-04-30 NOTE — Progress Notes (Signed)
Cardiology Office Note:    Date:  04/30/2022   ID:  Cynthia Osborn, DOB [birth year], MRN [id number]  PCP:  Gladstone Lighter, MD   [place] Providers Cardiologist:  None     Referring MD: Gladstone Lighter, MD   Chief Complaint  Patient presents with   Other    F/u echo/zio/CT c/o left leg/arm discomfort. Meds reviewed verbally with pt.    History of Present Illness:    Cynthia Osborn is a 47 y.o. female, former smoker x10 years who presents for follow-up.  Previously seen due to palpitations and chest tightness.  Echo and coronary CTA obtained to evaluate CAD.  Cardiac monitor closely placed to evaluate of the significant arrhythmias.  Presents for cardiac testing results.  States symptoms of palpitations sometimes occurs when she does not sleep appropriately.   Past Medical History:  Diagnosis Date   Cancer ([place])    skin   Legally blind     Past Surgical History:  Procedure Laterality Date   AUGMENTATION MAMMAPLASTY Bilateral    2000   BREAST ENHANCEMENT SURGERY     CESAREAN SECTION     COLONOSCOPY N/A 01/29/2022   Procedure: COLONOSCOPY;  Surgeon: Annamaria Helling, DO;  Location: [facility] ENDOSCOPY;  Service: Gastroenterology;  Laterality: N/A;   cyro     INTRAUTERINE DEVICE (IUD) INSERTION      Current Medications: Current Meds  Medication Sig   Cholecalciferol (D 1000) 25 MCG (1000 UT) capsule Take 1,000 Units by mouth daily.   cyanocobalamin (VITAMIN B12) 1000 MCG/ML injection Inject 1,000 mcg into the muscle once.   levonorgestrel (MIRENA) 20 MCG/24HR IUD 1 each by Intrauterine route once.     Allergies:   Patient has no known allergies.   Social History   Socioeconomic History   Marital status: Married    Spouse name: Not on file   Number of children: Not on file   Years of education: Not on file   Highest education level: Not on file  Occupational History   Not on file  Tobacco Use   Smoking status: Former    Packs/day: 0.50     Types: Cigarettes    Quit date: 04/26/1996    Years since quitting: 26.0   Smokeless tobacco: Never  Vaping Use   Vaping Use: Never used  Substance and Sexual Activity   Alcohol use: Yes    Alcohol/week: 4.0 standard drinks of alcohol    Types: 4 Glasses of wine per week   Drug use: No   Sexual activity: Yes    Birth control/protection: I.U.D.    Comment: Mirena  Other Topics Concern   Not on file  Social History Narrative   Not on file   Social Determinants of Health   Financial Resource Strain: Not on file  Food Insecurity: Not on file  Transportation Needs: Not on file  Physical Activity: Not on file  Stress: Not on file  Social Connections: Not on file     Family History: The patient's family history includes Breast cancer in her maternal aunt; Diabetes in her maternal aunt and maternal grandmother.  ROS:   Please see the history of present illness.     All other systems reviewed and are negative.  EKGs/Labs/Other Studies Reviewed:    The following studies were reviewed today:   EKG:  EKG not ordered today.    Recent Labs: 02/28/2022: BUN 9; Creatinine, Ser 0.85; Hemoglobin 13.1; Platelets 176; Potassium 4.0; Sodium 139; TSH 0.885  Recent Lipid  Panel No results found for: "CHOL", "TRIG", "HDL", "CHOLHDL", "VLDL", "LDLCALC", "LDLDIRECT"   Risk Assessment/Calculations:             Physical Exam:    VS:  BP 120/70 (BP Location: Left Arm, Patient Position: Sitting, Cuff Size: Normal)   Pulse 83   Ht '5\' 5"'$  (1.651 m)   Wt 156 lb 6 oz (70.9 kg)   SpO2 98%   BMI 26.02 kg/m     Wt Readings from Last 3 Encounters:  04/30/22 156 lb 6 oz (70.9 kg)  03/05/22 154 lb 6.4 oz (70 kg)  02/28/22 150 lb (68 kg)     GEN:  Well nourished, well developed in no acute distress HEENT: Normal NECK: No JVD; No carotid bruits CARDIAC: RRR, no murmurs, rubs, gallops RESPIRATORY:  Clear to auscultation without rales, wheezing or rhonchi  ABDOMEN: Soft, non-tender,  non-distended MUSCULOSKELETAL:  No edema; No deformity  SKIN: Warm and dry NEUROLOGIC:  Alert and oriented x 3 PSYCHIATRIC:  Normal affect   ASSESSMENT:    1. Palpitations   2. Precordial pain     PLAN:    In order of problems listed above:  Palpitations, paroxysmal SVT on cardiac monitor, patient triggered events associated with PVCs.  Overall benign cardiac monitor.  Patient made aware of results, reassured.  Beta-blockers discussed, patient would not want to take beta-blockers which I agree with and totally reasonable.  Her symptoms are overall benign. Chest pain, coronary CT calcium score 0, no evidence of CAD.  EF 55 to 83%, diastolic function normal.  Patient made aware of results, reassured.  Follow-up as needed.      Medication Adjustments/Labs and Tests Ordered: Current medicines are reviewed at length with the patient today.  Concerns regarding medicines are outlined above.  No orders of the defined types were placed in this encounter.  No orders of the defined types were placed in this encounter.   Patient Instructions  Medication Instructions:   Your physician recommends that you continue on your current medications as directed. Please refer to the Current Medication list given to you today.  *If you need a refill on your cardiac medications before your next appointment, please call your pharmacy*   Lab Work:  None Ordered  If you have labs (blood work) drawn today and your tests are completely normal, you will receive your results only by: [place] (if you have MyChart) OR A paper copy in the mail If you have any lab test that is abnormal or we need to change your treatment, we will call you to review the results.   Testing/Procedures:  None Ordered   Follow-Up: At [facility], you and your health needs are our priority.  As part of our continuing mission to provide you with exceptional heart care, we have created designated Provider  Care Teams.  These Care Teams include your primary Cardiologist (physician) and Advanced Practice Providers (APPs -  Physician Assistants and Nurse Practitioners) who all work together to provide you with the care you need, when you need it.  We recommend signing up for the patient portal called "MyChart".  Sign up information is provided on this After Visit Summary.  MyChart is used to connect with patients for Virtual Visits (Telemedicine).  Patients are able to view lab/test results, encounter notes, upcoming appointments, etc.  Non-urgent messages can be sent to your provider as well.   To learn more about what you can do with MyChart, go to [email or web address].  Your next appointment:  AS NEEDED   Signed, Kate Sable, MD  04/30/2022 11:36 AM    [place]

## 2022-04-30 NOTE — Patient Instructions (Signed)
Medication Instructions:   Your physician recommends that you continue on your current medications as directed. Please refer to the Current Medication list given to you today.  *If you need a refill on your cardiac medications before your next appointment, please call your pharmacy*   Lab Work:  None Ordered  If you have labs (blood work) drawn today and your tests are completely normal, you will receive your results only by: MyChart Message (if you have MyChart) OR A paper copy in the mail If you have any lab test that is abnormal or we need to change your treatment, we will call you to review the results.   Testing/Procedures:  None Ordered   Follow-Up: At [facility], you and your health needs are our priority.  As part of our continuing mission to provide you with exceptional heart care, we have created designated Provider Care Teams.  These Care Teams include your primary Cardiologist (physician) and Advanced Practice Providers (APPs -  Physician Assistants and Nurse Practitioners) who all work together to provide you with the care you need, when you need it.  We recommend signing up for the patient portal called "MyChart".  Sign up information is provided on this After Visit Summary.  MyChart is used to connect with patients for Virtual Visits (Telemedicine).  Patients are able to view lab/test results, encounter notes, upcoming appointments, etc.  Non-urgent messages can be sent to your provider as well.   To learn more about what you can do with MyChart, go to [email or web address].    Your next appointment:    AS NEEDED  

## 2022-08-02 NOTE — Progress Notes (Unsigned)
PCP: Enid Baas, MD   No chief complaint on file.   HPI:      [name] is a 47 y.o. G2P1011 whose LMP was No LMP recorded. (Menstrual status: IUD)., presents today for her annual examination.  Her menses are {norm/abn:715}, lasting {number: 22536} days.  Dysmenorrhea {dysmen:716}. She {does:18564} have intermenstrual bleeding. She {does:18564} have vasomotor sx.   Sex activity: {sex active: 315163}. She {does:18564} have vaginal dryness. Mirena placed 02/24/18  Last Pap: 04/28/20 Results were: no abnormalities /neg HPV DNA.  Hx of STDs: {STD hx:14358}  Last mammogram: 09/09/21 Results were: normal--routine follow-up in 12 months There is no FH of breast cancer. There is no FH of ovarian cancer. The patient {does:18564} do self-breast exams.  Colonoscopy: {hx:15363}  Repeat due after 10*** years.   Tobacco use: {tob:20664} Alcohol use: {Alcohol:11675} No drug use Exercise: {exercise:31265}  She {does:18564} get adequate calcium and Vitamin D in her diet.  Labs with PCP.   There are no problems to display for this patient.   Past Surgical History:  Procedure Laterality Date   AUGMENTATION MAMMAPLASTY Bilateral    2000   BREAST ENHANCEMENT SURGERY     CESAREAN SECTION     COLONOSCOPY N/A 01/29/2022   Procedure: COLONOSCOPY;  Surgeon: Jaynie Collins, DO;  Location: [facility] ENDOSCOPY;  Service: Gastroenterology;  Laterality: N/A;   cyro     INTRAUTERINE DEVICE (IUD) INSERTION      Family History  Problem Relation Age of Onset   Diabetes Maternal Grandmother    Breast cancer Maternal Aunt        27s   Diabetes Maternal Aunt     Social History   Socioeconomic History   Marital status: Married    Spouse name: Not on file   Number of children: Not on file   Years of education: Not on file   Highest education level: Not on file  Occupational History   Not on file  Tobacco Use   Smoking status: Former    Packs/day: .5    Types: Cigarettes     Quit date: 04/26/1996    Years since quitting: 26.2   Smokeless tobacco: Never  Vaping Use   Vaping Use: Never used  Substance and Sexual Activity   Alcohol use: Yes    Alcohol/week: 4.0 standard drinks of alcohol    Types: 4 Glasses of wine per week   Drug use: No   Sexual activity: Yes    Birth control/protection: I.U.D.    Comment: Mirena  Other Topics Concern   Not on file  Social History Narrative   Not on file   Social Determinants of Health   Financial Resource Strain: Not on file  Food Insecurity: Not on file  Transportation Needs: Not on file  Physical Activity: Not on file  Stress: Not on file  Social Connections: Not on file  Intimate Partner Violence: Not on file     Current Outpatient Medications:    Cholecalciferol (D 1000) 25 MCG (1000 UT) capsule, Take 1,000 Units by mouth daily., Disp: , Rfl:    cyanocobalamin (VITAMIN B12) 1000 MCG/ML injection, Inject 1,000 mcg into the muscle once., Disp: , Rfl:    levonorgestrel (MIRENA) 20 MCG/24HR IUD, 1 each by Intrauterine route once., Disp: , Rfl:      ROS:  Review of Systems BREAST: No symptoms    Objective: There were no vitals taken for this visit.   OBGyn Exam  Results: No results found for this  or any previous visit (from the past 24 hour(s)).  Assessment/Plan:  No diagnosis found.   No orders of the defined types were placed in this encounter.           GYN counsel {counseling: 16159}    F/U  No follow-ups on file.  Breyanna Valera B. Cliffton Spradley, PA-C 08/02/2022 8:39 PM

## 2022-08-03 ENCOUNTER — Encounter: Payer: Self-pay | Admitting: Obstetrics and Gynecology

## 2022-08-03 ENCOUNTER — Ambulatory Visit (INDEPENDENT_AMBULATORY_CARE_PROVIDER_SITE_OTHER): Payer: BC Managed Care – PPO | Admitting: Obstetrics and Gynecology

## 2022-08-03 VITALS — BP 100/64 | Ht 65.0 in | Wt 157.0 lb

## 2022-08-03 DIAGNOSIS — Z30431 Encounter for routine checking of intrauterine contraceptive device: Secondary | ICD-10-CM

## 2022-08-03 DIAGNOSIS — Z1211 Encounter for screening for malignant neoplasm of colon: Secondary | ICD-10-CM

## 2022-08-03 DIAGNOSIS — Z1231 Encounter for screening mammogram for malignant neoplasm of breast: Secondary | ICD-10-CM

## 2022-08-03 DIAGNOSIS — Z01419 Encounter for gynecological examination (general) (routine) without abnormal findings: Secondary | ICD-10-CM

## 2022-08-03 NOTE — Patient Instructions (Addendum)
I value your feedback and you entrusting us with your care. If you get a [facility] patient survey, I would appreciate you taking the time to let us know about your experience today. Thank you!  [facility]: [phone]      

## 2022-09-06 ENCOUNTER — Ambulatory Visit
Admission: RE | Admit: 2022-09-06 | Discharge: 2022-09-06 | Disposition: A | Discharge status: Home / Self Care | Payer: BC Managed Care – PPO | Source: Ambulatory Visit | Attending: Obstetrics and Gynecology | Admitting: Obstetrics and Gynecology

## 2022-09-06 ENCOUNTER — Other Ambulatory Visit: Payer: Self-pay | Admitting: Obstetrics and Gynecology

## 2022-09-06 DIAGNOSIS — Z1231 Encounter for screening mammogram for malignant neoplasm of breast: Secondary | ICD-10-CM | POA: Diagnosis present

## 2022-09-06 DIAGNOSIS — Z30431 Encounter for routine checking of intrauterine contraceptive device: Secondary | ICD-10-CM

## 2022-09-06 DIAGNOSIS — Z01419 Encounter for gynecological examination (general) (routine) without abnormal findings: Secondary | ICD-10-CM

## 2023-07-11 ENCOUNTER — Encounter (INDEPENDENT_AMBULATORY_CARE_PROVIDER_SITE_OTHER): Payer: Self-pay | Admitting: Nurse Practitioner

## 2023-07-11 ENCOUNTER — Other Ambulatory Visit (INDEPENDENT_AMBULATORY_CARE_PROVIDER_SITE_OTHER): Payer: Self-pay | Admitting: Nurse Practitioner

## 2023-07-11 ENCOUNTER — Ambulatory Visit (INDEPENDENT_AMBULATORY_CARE_PROVIDER_SITE_OTHER)

## 2023-07-11 ENCOUNTER — Ambulatory Visit (INDEPENDENT_AMBULATORY_CARE_PROVIDER_SITE_OTHER): Payer: BC Managed Care – PPO | Admitting: Nurse Practitioner

## 2023-07-11 VITALS — BP 110/53 | HR 75 | Resp 16 | Ht 65.5 in | Wt 158.0 lb

## 2023-07-11 DIAGNOSIS — I8312 Varicose veins of left lower extremity with inflammation: Secondary | ICD-10-CM

## 2023-07-11 DIAGNOSIS — I8311 Varicose veins of right lower extremity with inflammation: Secondary | ICD-10-CM | POA: Diagnosis not present

## 2023-07-11 DIAGNOSIS — I83813 Varicose veins of bilateral lower extremities with pain: Secondary | ICD-10-CM

## 2023-07-12 ENCOUNTER — Encounter (INDEPENDENT_AMBULATORY_CARE_PROVIDER_SITE_OTHER): Payer: Self-pay | Admitting: Nurse Practitioner

## 2023-07-12 NOTE — Progress Notes (Signed)
 Subjective:    Patient ID: Cynthia Osborn, female    DOB: [birth year], 48 y.o.   MRN: [id number] Chief Complaint  Patient presents with   New Patient (Initial Visit)    Ref Nemiah Commander consult ble varicose veins     The patient is seen for evaluation of symptomatic varicose veins. The patient relates burning and stinging which worsened steadily throughout the course of the day, particularly with standing. The patient also notes an aching and throbbing pain over the varicosities, particularly with prolonged dependent positions. The symptoms are significantly improved with elevation.  The patient also notes that during hot weather the symptoms are greatly intensified. The patient states the pain from the varicose veins interferes with work, daily exercise, shopping and household maintenance. At this point, the symptoms are persistent and severe enough that they're having a negative impact on lifestyle and are interfering with daily activities.  There is no history of DVT, PE or superficial thrombophlebitis. There is no history of ulceration or hemorrhage. The patient denies a significant family history of varicose veins.  The patient has worn graduated compression in the past. At the present time the patient has been using over-the-counter analgesics.  The patient previously had sclerotherapy about 10 years ago and it was very helpful for her.  Noninvasive study showed no evidence of DVT or superficial thrombophlebitis bilaterally.  No evidence of saphenous vein reflux or deep venous insufficiency bilaterally.    Review of Systems  All other systems reviewed and are negative.      Objective:   Physical Exam Vitals reviewed.  HENT:     Head: Normocephalic.  Cardiovascular:     Rate and Rhythm: Normal rate.     Pulses: Normal pulses.  Pulmonary:     Effort: Pulmonary effort is normal.  Skin:    General: Skin is warm and dry.  Neurological:     Mental Status: She is alert and  oriented to person, place, and time.  Psychiatric:        Mood and Affect: Mood normal.        Behavior: Behavior normal.        Thought Content: Thought content normal.        Judgment: Judgment normal.     BP (!) 110/53   Pulse 75   Resp 16   Ht 5' 5.5" (1.664 m)   Wt 158 lb (71.7 kg)   BMI 25.89 kg/m   Past Medical History:  Diagnosis Date   Cancer (HCC)    skin   Legally blind     Social History   Socioeconomic History   Marital status: Married    Spouse name: Not on file   Number of children: Not on file   Years of education: Not on file   Highest education level: Not on file  Occupational History   Not on file  Tobacco Use   Smoking status: Former    Current packs/day: 0.00    Types: Cigarettes    Quit date: 04/26/1996    Years since quitting: 27.2   Smokeless tobacco: Never  Vaping Use   Vaping status: Never Used  Substance and Sexual Activity   Alcohol use: Yes    Alcohol/week: 4.0 standard drinks of alcohol    Types: 4 Glasses of wine per week   Drug use: No   Sexual activity: Yes    Birth control/protection: I.U.D.    Comment: Mirena  Other Topics Concern   Not on file  Social  History Narrative   Not on file   Social Drivers of Health   Financial Resource Strain: Low Risk  (06/13/2023)   Received from [facility]   Overall Financial Resource Strain (CARDIA)    Difficulty of Paying Living Expenses: Not hard at all  Food Insecurity: No Food Insecurity (06/13/2023)   Received from [facility]   Hunger Vital Sign    Worried About Running Out of Food in the Last Year: Never true    Ran Out of Food in the Last Year: Never true  Transportation Needs: No Transportation Needs (06/13/2023)   Received [facility] - Transportation    In the past 12 months, has lack of transportation kept you from medical appointments or from getting medications?: No    Lack of Transportation  (Non-Medical): No  Physical Activity: Not on file  Stress: Not on file  Social Connections: Not on file  Intimate Partner Violence: Not on file    Past Surgical History:  Procedure Laterality Date   AUGMENTATION MAMMAPLASTY Bilateral    2000   BREAST ENHANCEMENT SURGERY     CESAREAN SECTION     COLONOSCOPY N/A 01/29/2022   Procedure: COLONOSCOPY;  Surgeon: Jaynie Collins, DO;  Location: [facility] ENDOSCOPY;  Service: Gastroenterology;  Laterality: N/A;   cyro     INTRAUTERINE DEVICE (IUD) INSERTION      Family History  Problem Relation Age of Onset   Diabetes Maternal Grandmother    Breast cancer Maternal Aunt        60s   Diabetes Maternal Aunt     Allergies  Allergen Reactions   Doxycycline Nausea And Vomiting       Latest Ref Rng & Units 02/28/2022    1:32 PM  CBC  WBC 4.0 - 10.5 K/uL 4.9   Hemoglobin 12.0 - 15.0 g/dL 18.8   Hematocrit 41.6 - 46.0 % 38.9   Platelets 150 - 400 K/uL 176       CMP     Component Value Date/Time   NA 139 02/28/2022 1332   K 4.0 02/28/2022 1332   CL 105 02/28/2022 1332   CO2 27 02/28/2022 1332   GLUCOSE 154 (H) 02/28/2022 1332   BUN 9 02/28/2022 1332   CREATININE 0.85 02/28/2022 1332   CALCIUM 9.3 02/28/2022 1332   GFRNONAA >60 02/28/2022 1332     No results found.     Assessment & Plan:   1. Varicose veins of both lower extremities with inflammation (Primary) Recommend:  The patient has persistent symptoms of pain and discomfort that are having a negative impact on daily life and daily activities.  Patient should undergo injection sclerotherapy to treat the residual varicosities.  The risks, benefits and alternative therapies were reviewed in detail with the patient.  All questions were answered.  The patient agrees to proceed with sclerotherapy at their convenience.  The patient will continue wearing the graduated compression stockings and using the over-the-counter pain medications to treat her symptoms.        Current Outpatient Medications on File Prior to Visit  Medication Sig Dispense Refill   Cholecalciferol (D 1000) 25 MCG (1000 UT) capsule Take 1,000 Units by mouth daily.     Cyanocobalamin (VITAMIN B-12 PO) Take by mouth.     folic acid (FOLVITE) 1 MG tablet Take 1 mg by mouth daily.     levonorgestrel (MIRENA) 20 MCG/24HR IUD 1 each by Intrauterine route once.  cyanocobalamin (VITAMIN B12) 1000 MCG/ML injection Inject 1,000 mcg into the muscle once.     No current facility-administered medications on file prior to visit.    There are no Patient Instructions on file for this visit. No follow-ups on file.   Georgiana Spinner, NP

## 2023-08-02 ENCOUNTER — Other Ambulatory Visit: Payer: Self-pay | Admitting: Obstetrics and Gynecology

## 2023-08-02 DIAGNOSIS — Z1231 Encounter for screening mammogram for malignant neoplasm of breast: Secondary | ICD-10-CM

## 2023-08-23 ENCOUNTER — Telehealth (INDEPENDENT_AMBULATORY_CARE_PROVIDER_SITE_OTHER): Payer: Self-pay | Admitting: Nurse Practitioner

## 2023-08-23 NOTE — Telephone Encounter (Signed)
 Spoke with patient to advise of the denial for sclerotherapy. I explained the denial as no prior laser ablation as well as no reflux found on the doppler ultrasound. Patient acknowledged and advised that she would think of out of pocket expenses and call us  back if needed.

## 2023-09-05 ENCOUNTER — Encounter (HOSPITAL_COMMUNITY): Payer: Self-pay

## 2023-09-12 ENCOUNTER — Ambulatory Visit
Admission: RE | Admit: 2023-09-12 | Discharge: 2023-09-12 | Disposition: A | Discharge status: Home / Self Care | Source: Ambulatory Visit | Attending: Obstetrics and Gynecology | Admitting: Obstetrics and Gynecology

## 2023-09-12 DIAGNOSIS — Z1231 Encounter for screening mammogram for malignant neoplasm of breast: Secondary | ICD-10-CM | POA: Diagnosis present

## 2023-09-13 ENCOUNTER — Encounter (INDEPENDENT_AMBULATORY_CARE_PROVIDER_SITE_OTHER): Payer: Self-pay

## 2023-09-18 ENCOUNTER — Ambulatory Visit: Payer: Self-pay | Admitting: Obstetrics and Gynecology

## 2024-02-29 ENCOUNTER — Other Ambulatory Visit: Payer: Self-pay | Admitting: Obstetrics and Gynecology

## 2024-02-29 DIAGNOSIS — Z1231 Encounter for screening mammogram for malignant neoplasm of breast: Secondary | ICD-10-CM

## 2024-05-02 ENCOUNTER — Ambulatory Visit (INDEPENDENT_AMBULATORY_CARE_PROVIDER_SITE_OTHER): Payer: Self-pay

## 2024-05-02 DIAGNOSIS — R238 Other skin changes: Secondary | ICD-10-CM

## 2024-05-02 DIAGNOSIS — Z411 Encounter for cosmetic surgery: Secondary | ICD-10-CM

## 2024-05-02 NOTE — Progress Notes (Signed)
 "  New Patient Office Visit  Subjective    Patient ID: Cynthia Osborn, female    DOB: Nov 12, 1975  Age: 49 y.o. MRN: [id number]  CC: No chief complaint on file.   HPI Cynthia Osborn presents to establish care  49 y.o. year old female who presents in consultation for facial aging.    Specifically, the patient is most bothered by the appearance of her skin on face, neck, dorsum of her hands and chest. She is bothered by the sun damage, hipo and hyperpigmented spots, rosacea and fine rythids.      History of dry eyes? Denies        Prior refractive surgery? Denies   Use of neurotoxin/fillers? Denies   Patient denies h/o glaucoma, HTN, coagulopathies, or thyroid  conditions. Not on any anticoagulation.    Patient uses Retinol and has a good skin care regimen.   Outpatient Encounter Medications as of 05/02/2024  Medication Sig   Cholecalciferol (D 1000) 25 MCG (1000 UT) capsule Take 1,000 Units by mouth daily.   Cyanocobalamin  (VITAMIN B-12 PO) Take by mouth.   cyanocobalamin  (VITAMIN B12) 1000 MCG/ML injection Inject 1,000 mcg into the muscle once.   folic acid (FOLVITE) 1 MG tablet Take 1 mg by mouth daily.   levonorgestrel  (MIRENA ) 20 MCG/24HR IUD 1 each by Intrauterine route once.   No facility-administered encounter medications on file as of 05/02/2024.    Past Medical History:  Diagnosis Date   Cancer (HCC)    skin   Legally blind     Past Surgical History:  Procedure Laterality Date   AUGMENTATION MAMMAPLASTY Bilateral    2000   BREAST ENHANCEMENT SURGERY     CESAREAN SECTION     COLONOSCOPY N/A 01/29/2022   Procedure: COLONOSCOPY;  Surgeon: Onita Elspeth Sharper, DO;  Location: [facility] ENDOSCOPY;  Service: Gastroenterology;  Laterality: N/A;   cyro     INTRAUTERINE DEVICE (IUD) INSERTION      Family History  Problem Relation Age of Onset   Diabetes Maternal Grandmother    Breast cancer Maternal Aunt        68s   Diabetes Maternal Aunt     Social History    Socioeconomic History   Marital status: Married    Spouse name: Not on file   Number of children: Not on file   Years of education: Not on file   Highest education level: Not on file  Occupational History   Not on file  Tobacco Use   Smoking status: Former    Current packs/day: 0.00    Average packs/day: 0.5 packs/day    Types: Cigarettes    Quit date: 04/26/1996    Years since quitting: 28.0   Smokeless tobacco: Never  Vaping Use   Vaping status: Never Used  Substance and Sexual Activity   Alcohol use: Yes    Alcohol/week: 4.0 standard drinks of alcohol    Types: 4 Glasses of wine per week   Drug use: No   Sexual activity: Yes    Birth control/protection: I.U.D.    Comment: Mirena   Other Topics Concern   Not on file  Social History Narrative   Not on file   Social Drivers of Health   Tobacco Use: Medium Risk (02/27/2024)   Received from [facility]   Patient History    Smoking Tobacco Use: Former    Smokeless Tobacco Use: Never    Passive Exposure: Not on file  Financial Resource Strain: Low Risk  (02/27/2024)  Received from [facility]   Overall Financial Resource Strain (CARDIA)    Difficulty of Paying Living Expenses: Not hard at all  Food Insecurity: No Food Insecurity (02/27/2024)   Received from [facility]   Epic    Within the past 12 months, you worried that your food would run out before you got the money to buy more.: Never true    Within the past 12 months, the food you bought just didn't last and you didn't have money to get more.: Never true  Transportation Needs: No Transportation Needs (02/27/2024)   Received from [facility] - Transportation    In the past 12 months, has lack of transportation kept you from medical appointments or from getting medications?: No    Lack of Transportation (Non-Medical): No  Physical Activity: Not on file  Stress: Not on file  Social  Connections: Not on file  Intimate Partner Violence: Not on file  Depression (EYV7-0): Not on file  Alcohol Screen: Not on file  Housing: Low Risk  (02/27/2024)   Received from [facility]   Epic    In the last 12 months, was there a time when you were not able to pay the mortgage or rent on time?: No    In the past 12 months, how many times have you moved where you were living?: 0    At any time in the past 12 months, were you homeless or living in a shelter (including now)?: No  Utilities: Not At Risk (02/27/2024)   Received from [facility]   Epic    In the past 12 months has the electric, gas, oil, or water company threatened to shut off services in your home?: No  Health Literacy: Not on file    ROS ROS negative except as noted in HPI     Objective    There were no vitals taken for this visit.  Physical Exam Constitutional:      Appearance: Normal appearance.  Cardiovascular:     Rate and Rhythm: Normal rate.  Skin:    Coloration: Skin on face with rosacea, redness and hyperpigmented spots on face neck chest and dorsum of hands with signs of aging. Good skin quality. Fitzpatrick 2.    Findings: No bruising or lesion.  Neurological:     Mental Status: She is alert and oriented to person, place, and time.  Psychiatric:        Mood and Affect: Mood normal.        Behavior: Behavior normal.        Thought Content: Thought content normal.        Judgment: Judgment normal.   Last CBC Lab Results  Component Value Date   WBC 4.9 02/28/2022   HGB 13.1 02/28/2022   HCT 38.9 02/28/2022   MCV 95.8 02/28/2022   MCH 32.3 02/28/2022   RDW 11.9 02/28/2022   PLT 176 02/28/2022   Last metabolic panel Lab Results  Component Value Date   GLUCOSE 154 (H) 02/28/2022   NA 139 02/28/2022   K 4.0 02/28/2022   CL 105 02/28/2022   CO2 27 02/28/2022   BUN 9 02/28/2022   CREATININE 0.85 02/28/2022   GFRNONAA >60 02/28/2022   CALCIUM 9.3  02/28/2022   ANIONGAP 7 02/28/2022      Assessment & Plan:   Problem List Items Addressed This Visit   None Visit Diagnoses  Facial aging    -  Primary     Encounter for cosmetic procedure          Patient is a very good candidate for a Halo Laser + BBL laser on face, neck and chest. Micropeel of bilateral hands also recommended. Stop retinol 3-4 weeks before procedure. Risks and benefits discussed with patient, including hyperpigmentation, hypopigmentation, scarring, unsatisfactory aesthetic resutls, pain and infection. Patient expresses understanding and wants to proceed with laser. We will provide her with quotes for lasers.   Cynthia Meharg M Vicy Medico, MD   "

## 2024-07-18 ENCOUNTER — Other Ambulatory Visit
